# Patient Record
Sex: Male | Born: 1960 | Race: White | Hispanic: No
Health system: Southern US, Community
[De-identification: ages and names within clinical notes are randomized; demographics above are authoritative.]

---

## 2002-10-04 ENCOUNTER — Emergency Department (HOSPITAL_COMMUNITY): Admission: EM | Admit: 2002-10-04 | Discharge: 2002-10-04 | Payer: Self-pay | Admitting: Emergency Medicine

## 2002-10-04 ENCOUNTER — Encounter: Payer: Self-pay | Admitting: Emergency Medicine

## 2002-11-14 ENCOUNTER — Emergency Department (HOSPITAL_COMMUNITY): Admission: EM | Admit: 2002-11-14 | Discharge: 2002-11-14 | Payer: Self-pay | Admitting: Emergency Medicine

## 2002-11-14 ENCOUNTER — Encounter: Payer: Self-pay | Admitting: Emergency Medicine

## 2002-11-20 ENCOUNTER — Ambulatory Visit (HOSPITAL_COMMUNITY): Admission: RE | Admit: 2002-11-20 | Discharge: 2002-11-20 | Payer: Self-pay | Admitting: Infectious Diseases

## 2002-11-20 ENCOUNTER — Encounter: Admission: RE | Admit: 2002-11-20 | Discharge: 2002-11-20 | Payer: Self-pay | Admitting: Infectious Diseases

## 2002-11-26 ENCOUNTER — Emergency Department (HOSPITAL_COMMUNITY): Admission: EM | Admit: 2002-11-26 | Discharge: 2002-11-26 | Payer: Self-pay | Admitting: Emergency Medicine

## 2002-11-26 ENCOUNTER — Encounter: Payer: Self-pay | Admitting: Emergency Medicine

## 2002-12-04 ENCOUNTER — Encounter: Admission: RE | Admit: 2002-12-04 | Discharge: 2002-12-04 | Payer: Self-pay | Admitting: Infectious Diseases

## 2002-12-17 ENCOUNTER — Encounter: Payer: Self-pay | Admitting: Emergency Medicine

## 2002-12-17 ENCOUNTER — Emergency Department (HOSPITAL_COMMUNITY): Admission: EM | Admit: 2002-12-17 | Discharge: 2002-12-17 | Payer: Self-pay | Admitting: Emergency Medicine

## 2003-01-08 ENCOUNTER — Encounter: Admission: RE | Admit: 2003-01-08 | Discharge: 2003-01-08 | Payer: Self-pay | Admitting: Infectious Diseases

## 2003-03-12 ENCOUNTER — Encounter: Admission: RE | Admit: 2003-03-12 | Discharge: 2003-03-12 | Payer: Self-pay | Admitting: Infectious Diseases

## 2003-04-30 ENCOUNTER — Encounter: Admission: RE | Admit: 2003-04-30 | Discharge: 2003-04-30 | Payer: Self-pay | Admitting: Infectious Diseases

## 2003-07-09 ENCOUNTER — Encounter: Admission: RE | Admit: 2003-07-09 | Discharge: 2003-07-09 | Payer: Self-pay | Admitting: Infectious Diseases

## 2003-09-24 ENCOUNTER — Encounter: Admission: RE | Admit: 2003-09-24 | Discharge: 2003-09-24 | Payer: Self-pay | Admitting: Infectious Diseases

## 2003-09-24 ENCOUNTER — Encounter: Payer: Self-pay | Admitting: Infectious Diseases

## 2003-09-24 ENCOUNTER — Ambulatory Visit (HOSPITAL_COMMUNITY): Admission: RE | Admit: 2003-09-24 | Discharge: 2003-09-24 | Payer: Self-pay | Admitting: Infectious Diseases

## 2004-02-18 ENCOUNTER — Other Ambulatory Visit: Payer: Self-pay

## 2004-03-04 ENCOUNTER — Other Ambulatory Visit: Payer: Self-pay

## 2004-03-05 ENCOUNTER — Other Ambulatory Visit: Payer: Self-pay

## 2004-03-06 ENCOUNTER — Other Ambulatory Visit: Payer: Self-pay

## 2004-03-07 ENCOUNTER — Other Ambulatory Visit: Payer: Self-pay

## 2004-05-06 ENCOUNTER — Other Ambulatory Visit: Payer: Self-pay

## 2005-05-27 ENCOUNTER — Inpatient Hospital Stay: Payer: Self-pay | Admitting: Specialist

## 2005-05-27 ENCOUNTER — Other Ambulatory Visit: Payer: Self-pay

## 2005-06-24 ENCOUNTER — Emergency Department: Payer: Self-pay | Admitting: Emergency Medicine

## 2005-06-24 ENCOUNTER — Other Ambulatory Visit: Payer: Self-pay

## 2005-06-28 ENCOUNTER — Emergency Department: Payer: Self-pay | Admitting: Emergency Medicine

## 2005-07-06 ENCOUNTER — Inpatient Hospital Stay: Payer: Self-pay | Admitting: Specialist

## 2005-10-10 ENCOUNTER — Other Ambulatory Visit: Payer: Self-pay

## 2005-10-10 ENCOUNTER — Emergency Department: Payer: Self-pay | Admitting: Emergency Medicine

## 2006-02-16 ENCOUNTER — Inpatient Hospital Stay: Payer: Self-pay | Admitting: Psychiatry

## 2006-02-16 ENCOUNTER — Other Ambulatory Visit: Payer: Self-pay

## 2006-07-06 ENCOUNTER — Ambulatory Visit: Payer: Self-pay | Admitting: Family Medicine

## 2006-09-29 ENCOUNTER — Other Ambulatory Visit: Payer: Self-pay

## 2006-09-29 ENCOUNTER — Emergency Department: Payer: Self-pay | Admitting: Emergency Medicine

## 2006-10-26 ENCOUNTER — Other Ambulatory Visit: Payer: Self-pay

## 2006-10-26 ENCOUNTER — Inpatient Hospital Stay: Payer: Self-pay | Admitting: Internal Medicine

## 2007-02-03 ENCOUNTER — Emergency Department (HOSPITAL_COMMUNITY): Admission: EM | Admit: 2007-02-03 | Discharge: 2007-02-03 | Payer: Self-pay | Admitting: Emergency Medicine

## 2007-02-13 ENCOUNTER — Ambulatory Visit (HOSPITAL_COMMUNITY): Admission: RE | Admit: 2007-02-13 | Discharge: 2007-02-13 | Payer: Self-pay | Admitting: Family Medicine

## 2007-04-07 ENCOUNTER — Ambulatory Visit: Payer: Self-pay | Admitting: Specialist

## 2007-07-03 ENCOUNTER — Ambulatory Visit: Payer: Self-pay | Admitting: Specialist

## 2008-03-11 ENCOUNTER — Encounter: Payer: Self-pay | Admitting: Orthopedic Surgery

## 2008-03-26 ENCOUNTER — Encounter: Payer: Self-pay | Admitting: Orthopedic Surgery

## 2008-04-25 ENCOUNTER — Encounter: Payer: Self-pay | Admitting: Orthopedic Surgery

## 2008-05-02 ENCOUNTER — Ambulatory Visit (HOSPITAL_COMMUNITY): Admission: RE | Admit: 2008-05-02 | Discharge: 2008-05-02 | Payer: Self-pay | Admitting: *Deleted

## 2008-06-04 ENCOUNTER — Other Ambulatory Visit: Payer: Self-pay

## 2008-06-04 ENCOUNTER — Emergency Department: Payer: Self-pay | Admitting: Emergency Medicine

## 2008-08-03 ENCOUNTER — Emergency Department: Payer: Self-pay | Admitting: Emergency Medicine

## 2008-08-05 ENCOUNTER — Emergency Department: Payer: Self-pay | Admitting: Emergency Medicine

## 2008-08-05 ENCOUNTER — Other Ambulatory Visit: Payer: Self-pay

## 2008-08-09 ENCOUNTER — Other Ambulatory Visit: Payer: Self-pay

## 2008-08-09 ENCOUNTER — Emergency Department: Payer: Self-pay | Admitting: Emergency Medicine

## 2008-08-26 ENCOUNTER — Other Ambulatory Visit: Payer: Self-pay

## 2008-08-26 ENCOUNTER — Inpatient Hospital Stay: Payer: Self-pay | Admitting: Psychiatry

## 2008-08-31 ENCOUNTER — Other Ambulatory Visit: Payer: Self-pay

## 2008-09-12 ENCOUNTER — Emergency Department: Payer: Self-pay | Admitting: Emergency Medicine

## 2008-11-23 ENCOUNTER — Inpatient Hospital Stay: Payer: Self-pay | Admitting: Specialist

## 2009-05-28 ENCOUNTER — Inpatient Hospital Stay: Payer: Self-pay | Admitting: Specialist

## 2009-06-27 ENCOUNTER — Inpatient Hospital Stay: Payer: Self-pay | Admitting: Specialist

## 2010-04-16 ENCOUNTER — Emergency Department: Payer: Self-pay | Admitting: Unknown Physician Specialty

## 2010-05-29 ENCOUNTER — Inpatient Hospital Stay: Payer: Self-pay | Admitting: Psychiatry

## 2011-05-13 NOTE — Procedures (Signed)
   NAME:  [name] [name], [name] [name]                             ACCOUNT NO.:  [id number]   MEDICAL RECORD NO.:  [id number]                   PATIENT TYPE:  EMS   LOCATION:  ED                                   FACILITY:  [facility]   PHYSICIAN:  Edward L. Juanetta Gosling, M.D.             DATE OF BIRTH:  [birth year]   DATE OF PROCEDURE:  DATE OF DISCHARGE:                                EKG INTERPRETATION   The rhythm is a sinus rhythm with a rate in the 70s.  There are Q-waves  inferiorly which may indicate a previous inferior myocardial infarction and  clinical correlation is suggested.  The axis is leftward.  There are T-wave  abnormalities in some of the precordial leads which may indicate ischemia  and there are also Q-waves in the lateral chest leads.  Abnormal  electrocardiogram.                                               Oneal Deputy. Juanetta Gosling, M.D.    ELH/MEDQ  D:  11/26/2002  T:  11/26/2002  Job:  [id number]

## 2011-05-13 NOTE — Consult Note (Signed)
NAMESTARR, [name] [name]                             ACCOUNT NO.:  [id number]   MEDICAL RECORD NO.:  [id number]                   PATIENT TYPE:  EMS   LOCATION:  ED                                   FACILITY:  [facility]   PHYSICIAN:  Janetta Hora. Hulan Saas, M.D.             DATE OF BIRTH:  [birth year]   DATE OF CONSULTATION:  DATE OF DISCHARGE:  11/14/2002                                   CONSULTATION   HISTORY OF PRESENT ILLNESS:  The patient is a 50 year old man with a history  of HIV infection, history of questionable coronary artery disease.  Presented to the ER this afternoon with a complaint of chest pain.  He  described the chest pain as left-sided, sharp in character, nonradiating,  associated with palpitations.  No nausea.  No vomiting.  No shortness of  breath.  The pain occurred while he was walking to eat his breakfast in a  restaurant.   REVIEW OF SYSTEMS:  He denies any dizziness.  Denies any dysuria.  Denies  any weakness or fever.  Denies any cough.  Denies any abdominal pain,  diarrhea, vomiting.   PAST MEDICAL HISTORY:  He has had HIV since 17 years ago.  A CD-4 count, the  last __________  check was 38 but he does not know the viral load.  He is  not on any antiretroviral medication.  He since developed a sort of chest  pain.  He has had cardiac catheterization done one year ago which was  negative for any blockage of any of the vessels.  He also has a history of  depression.   MEDICATIONS:  He is currently not on any medications at the moment.   ALLERGIES:  He has no known drug allergies.   FAMILY HISTORY:  Noncontributory.   SOCIAL HISTORY:  He is divorced.  He does not have any children.  He lives  in a rest home.  He is an ex-IV drug user.  He smokes about six cigarettes  every day and drinks alcohol occasionally.   PHYSICAL EXAMINATION:  VITAL SIGNS:  BP is 105/64, heart rate 72.  GENERAL:  He is a middle aged man lying comfortably on a stretcher, not in  any  apparent distress.  HEENT:  He is not pale.  He is anicteric.  He has no oral thrush.  NECK:  Supple.  There is no jugular venous distention.  CHEST:  He has bilateral expiratory wheezes.  No crackles were heard.  Air  entry is good bilaterally.  CARDIAC:  PMI is located in the fifth left intercostal space anterior  axillary line.  Heart sounds 1 and 2 were regular.  No murmurs were  palpated.  ABDOMEN:  Soft, nontender.  No masses or organomegaly were felt.  Bowel  sounds are present.  CNS:  He is alert and oriented x3.  He has no focal or gross  neurologic  deficits.  EXTREMITIES:  He has no pedal edema.  SKIN:  No rashes or ulcerations were noted.   LABORATORIES:  EKG was normal sinus rhythm with left axis deviation.  Normal  intervals.  He had some Q-waves in lead II, III, and aVF, T-wave inversion  in lead V2.  His chest x-ray shows COPD, no acute infiltrate.  His cardiac  enzymes troponin 0.01, CK 72,  MB fraction is 1.6.  His sodium is 136,  potassium 4.1, chloride 106, CO2 30, BUN 6, creatinine 0.9, chloride 103,  calcium 9.5.  WBC 3.9, hemoglobin 15.8, hematocrit 46.7, MCV 88.8, platelet  count 108.  Neutrophil 54%, lymphocyte 28%, monocyte 9%.  The patient has  already been treated with Lovenox and aspirin in the emergency room.   ASSESSMENT:  1. Chest pain with abnormal EKG.  Admit patient to rule out acute myocardial     infarction.  The patient is to be admitted to rule out MI with serial     cardiac enzymes for telemetry monitoring.  Will also be given aspirin,     nitroglycerin, and beta blockers.  Cardiology consult to call for     evaluation.  2. For the patient's history of HIV infection and AIDS, his CD-4 count and     viral load were monitored.  The patient, however, refuses to be admitted     at this time.  The risks and benefits of admission __________  have been     explained to patient and patient has been given time to think about it     but he still signed  out against medical advice.  The patient will then be     discharged on aspirin, metoprolol, nitroglycerin p.r.n.  The patient has     been advised to see his primary M.D. within one to two days.  The     patient's discharge condition is stable.     [name] [name], M.D.                        Janetta Hora. Hulan Saas, M.D.    DW/MEDQ  D:  11/14/2002  T:  11/14/2002  Job:  [id number]

## 2011-05-13 NOTE — Procedures (Signed)
   NAME:  [name] [name], [name] [name]                             ACCOUNT NO.:  [id number]   MEDICAL RECORD NO.:  [id number]                   PATIENT TYPE:  EMS   LOCATION:  ED                                   FACILITY:  [facility]   PHYSICIAN:  Edward L. Juanetta Gosling, M.D.             DATE OF BIRTH:  09/03/1961   DATE OF PROCEDURE:  DATE OF DISCHARGE:  12/17/2002                                EKG INTERPRETATION   TIME AND DATE:  1819 on 12/17/02   INTERPRETATION:  The rhythm is sinus rhythm with a rate in the 90s.  There  are Q waves inferiorly which could indicate a previous inferior myocardial  infarction.  T wave abnormalities are seen anteriorly and laterally which  could be related to ischemia and clinical correlation is suggested.   IMPRESSION:  Abnormal electrocardiogram.                                               Edward L. Juanetta Gosling, M.D.    ELH/MEDQ  D:  12/20/2002  T:  12/20/2002  Job:  [id number]

## 2011-12-26 ENCOUNTER — Inpatient Hospital Stay: Payer: Self-pay | Admitting: *Deleted

## 2011-12-27 LAB — BASIC METABOLIC PANEL
Calcium, Total: 8.5 mg/dL (ref 8.5–10.1)
Chloride: 109 mmol/L — ABNORMAL HIGH (ref 98–107)
Co2: 23 mmol/L (ref 21–32)
EGFR (African American): >60
Osmolality: 284 (ref 275–301)
Potassium: 3.8 mmol/L (ref 3.5–5.1)
Sodium: 140 mmol/L (ref 136–145)

## 2011-12-27 LAB — CBC WITH DIFFERENTIAL/PLATELET
Basophil #: 0 10*3/uL (ref 0.0–0.1)
Eosinophil %: 0 %
HGB: 13.2 g/dL (ref 13.0–18.0)
Lymphocyte %: 18.3 %
MCH: 33.3 pg (ref 26.0–34.0)
MCV: 98 fL (ref 80–100)
Monocyte %: 3.7 %
Neutrophil #: 9.4 10*3/uL — ABNORMAL HIGH (ref 1.4–6.5)
Neutrophil %: 77.8 %
RBC: 3.98 10*6/uL — ABNORMAL LOW (ref 4.40–5.90)

## 2011-12-27 LAB — PROTIME-INR
INR: 2.5
Prothrombin Time: 26.3 secs — ABNORMAL HIGH (ref 11.5–14.7)

## 2011-12-27 LAB — TROPONIN I: Troponin-I: 0.03 ng/mL

## 2011-12-27 LAB — LIPID PANEL
Cholesterol: 147 mg/dL (ref 0–200)
HDL Cholesterol: 32 mg/dL — ABNORMAL LOW (ref 40–60)
Triglycerides: 70 mg/dL (ref 0–200)

## 2011-12-28 LAB — DRUG SCREEN, URINE
Amphetamines, Ur Screen: NEGATIVE (ref ?–1000)
Benzodiazepine, Ur Scrn: NEGATIVE (ref ?–200)
Cannabinoid 50 Ng, Ur ~~LOC~~: NEGATIVE (ref ?–50)
MDMA (Ecstasy)Ur Screen: NEGATIVE (ref ?–500)
Opiate, Ur Screen: POSITIVE (ref ?–300)
Tricyclic, Ur Screen: NEGATIVE (ref ?–1000)

## 2011-12-28 LAB — CBC WITH DIFFERENTIAL/PLATELET
Eosinophil #: 0 10*3/uL (ref 0.0–0.7)
Eosinophil %: 0 %
Lymphocyte #: 2.1 10*3/uL (ref 1.0–3.6)
Lymphocyte %: 16.2 %
MCH: 32.8 pg (ref 26.0–34.0)
MCHC: 33.4 g/dL (ref 32.0–36.0)
MCV: 98 fL (ref 80–100)
Monocyte #: 0.5 10*3/uL (ref 0.0–0.7)
Neutrophil %: 79.4 %
Platelet: 136 10*3/uL — ABNORMAL LOW (ref 150–440)
RBC: 3.83 10*6/uL — ABNORMAL LOW (ref 4.40–5.90)
WBC: 12.8 10*3/uL — ABNORMAL HIGH (ref 3.8–10.6)

## 2011-12-28 LAB — BASIC METABOLIC PANEL
BUN: 17 mg/dL (ref 7–18)
Chloride: 110 mmol/L — ABNORMAL HIGH (ref 98–107)
Creatinine: 0.65 mg/dL (ref 0.60–1.30)
EGFR (African American): >60
Glucose: 133 mg/dL — ABNORMAL HIGH (ref 65–99)
Potassium: 3.9 mmol/L (ref 3.5–5.1)

## 2011-12-28 LAB — PROTIME-INR
INR: 3.7
Prothrombin Time: 35.4 secs — ABNORMAL HIGH (ref 11.5–14.7)

## 2011-12-29 LAB — PROTIME-INR
INR: 2.2
Prothrombin Time: 24.2 secs — ABNORMAL HIGH (ref 11.5–14.7)

## 2011-12-30 LAB — CBC WITH DIFFERENTIAL/PLATELET
Basophil %: 0.1 %
Eosinophil %: 0 %
HCT: 38.9 % — ABNORMAL LOW (ref 40.0–52.0)
HGB: 12.9 g/dL — ABNORMAL LOW (ref 13.0–18.0)
Lymphocyte #: 3.3 10*3/uL (ref 1.0–3.6)
Lymphocyte %: 25.6 %
MCH: 32.9 pg (ref 26.0–34.0)
MCV: 99 fL (ref 80–100)
Monocyte #: 0.9 10*3/uL — ABNORMAL HIGH (ref 0.0–0.7)
Monocyte %: 7.2 %
Neutrophil #: 8.5 10*3/uL — ABNORMAL HIGH (ref 1.4–6.5)
RBC: 3.92 10*6/uL — ABNORMAL LOW (ref 4.40–5.90)
RDW: 13.5 % (ref 11.5–14.5)
WBC: 12.7 10*3/uL — ABNORMAL HIGH (ref 3.8–10.6)

## 2011-12-30 LAB — PROTIME-INR: INR: 1.5

## 2011-12-31 LAB — PROTIME-INR

## 2011-12-31 LAB — BASIC METABOLIC PANEL
Anion Gap: 7 (ref 7–16)
Calcium, Total: 8.5 mg/dL (ref 8.5–10.1)
Co2: 29 mmol/L (ref 21–32)
EGFR (African American): >60
Osmolality: 280 (ref 275–301)

## 2012-01-02 LAB — EXPECTORATED SPUTUM ASSESSMENT W GRAM STAIN, RFLX TO RESP C

## 2012-01-06 LAB — BRONCHIAL WASH CULTURE

## 2012-06-26 ENCOUNTER — Inpatient Hospital Stay: Payer: Self-pay | Admitting: Internal Medicine

## 2012-06-26 LAB — COMPREHENSIVE METABOLIC PANEL
Alkaline Phosphatase: 136 U/L (ref 50–136)
Anion Gap: 8 (ref 7–16)
Calcium, Total: 8.9 mg/dL (ref 8.5–10.1)
Co2: 34 mmol/L — ABNORMAL HIGH (ref 21–32)
EGFR (African American): >60
EGFR (Non-African Amer.): >60
Osmolality: 262 (ref 275–301)
Potassium: 2.8 mmol/L — ABNORMAL LOW (ref 3.5–5.1)
Sodium: 132 mmol/L — ABNORMAL LOW (ref 136–145)

## 2012-06-26 LAB — PRO B NATRIURETIC PEPTIDE: B-Type Natriuretic Peptide: 1473 pg/mL — ABNORMAL HIGH (ref 0–125)

## 2012-06-26 LAB — VALPROIC ACID LEVEL: Valproic Acid: 11 ug/mL — ABNORMAL LOW

## 2012-06-26 LAB — CBC
HCT: 37.2 % — ABNORMAL LOW (ref 40.0–52.0)
MCH: 27.9 pg (ref 26.0–34.0)
MCHC: 31.7 g/dL — ABNORMAL LOW (ref 32.0–36.0)
MCV: 88 fL (ref 80–100)
RDW: 14.7 % — ABNORMAL HIGH (ref 11.5–14.5)

## 2012-06-27 DIAGNOSIS — I2510 Atherosclerotic heart disease of native coronary artery without angina pectoris: Secondary | ICD-10-CM

## 2012-06-27 DIAGNOSIS — Z01810 Encounter for preprocedural cardiovascular examination: Secondary | ICD-10-CM

## 2012-06-27 LAB — CBC WITH DIFFERENTIAL/PLATELET
Basophil #: 0 10*3/uL (ref 0.0–0.1)
Basophil #: 0.1 10*3/uL (ref 0.0–0.1)
Eosinophil #: 0.1 10*3/uL (ref 0.0–0.7)
Eosinophil %: 0 %
HCT: 29.1 % — ABNORMAL LOW (ref 40.0–52.0)
Lymphocyte #: 2.2 10*3/uL (ref 1.0–3.6)
Lymphocyte #: 2.9 10*3/uL (ref 1.0–3.6)
Lymphocyte %: 20 %
Lymphocyte %: 2.7 %
MCHC: 32.8 g/dL (ref 32.0–36.0)
MCV: 87 fL (ref 80–100)
Monocyte #: 1.1 x10 3/mm — ABNORMAL HIGH (ref 0.2–1.0)
Monocyte %: 6.4 %
Monocyte %: 2.6 %
Neutrophil %: 80.7 %
Neutrophil %: 94 %
Platelet: 379 10*3/uL (ref 150–440)
Platelet: 366 10*3/uL (ref 150–440)
Platelet: 424 10*3/uL (ref 150–440)
RBC: 3.27 10*6/uL — ABNORMAL LOW (ref 4.40–5.90)
RDW: 15 % — ABNORMAL HIGH (ref 11.5–14.5)
RDW: 15.4 % — ABNORMAL HIGH (ref 11.5–14.5)
WBC: 17.2 10*3/uL — ABNORMAL HIGH (ref 3.8–10.6)
WBC: 35.3 10*3/uL — ABNORMAL HIGH (ref 3.8–10.6)

## 2012-06-27 LAB — BODY FLUID CELL COUNT WITH DIFFERENTIAL
Eosinophil: 0 %
Lymphocytes: 10 %
Neutrophils: 90 %
Nucleated Cell Count: 90974 /mm3
Other Mononuclear Cells: 0 %

## 2012-06-27 LAB — PROTIME-INR
INR: 1.7
INR: 1.4
Prothrombin Time: 20.3 secs — ABNORMAL HIGH (ref 11.5–14.7)
Prothrombin Time: 17.2 secs — ABNORMAL HIGH (ref 11.5–14.7)

## 2012-06-27 LAB — PROTEIN, BODY FLUID

## 2012-06-27 LAB — BASIC METABOLIC PANEL
Anion Gap: 5 — ABNORMAL LOW (ref 7–16)
Calcium, Total: 8.5 mg/dL (ref 8.5–10.1)
Chloride: 94 mmol/L — ABNORMAL LOW (ref 98–107)
Co2: 34 mmol/L — ABNORMAL HIGH (ref 21–32)
Creatinine: 0.6 mg/dL (ref 0.60–1.30)
EGFR (African American): >60
EGFR (African American): >60
EGFR (Non-African Amer.): >60
Osmolality: 273 (ref 275–301)
Potassium: 3.4 mmol/L — ABNORMAL LOW (ref 3.5–5.1)
Sodium: 133 mmol/L — ABNORMAL LOW (ref 136–145)

## 2012-06-27 LAB — APTT: Activated PTT: 44.8 secs — ABNORMAL HIGH (ref 23.6–35.9)

## 2012-06-28 LAB — VANCOMYCIN, TROUGH: Vancomycin, Trough: 24 ug/mL (ref 10–20)

## 2012-06-28 LAB — EXPECTORATED SPUTUM ASSESSMENT W GRAM STAIN, RFLX TO RESP C

## 2012-06-29 LAB — TSH: Thyroid Stimulating Horm: 2.39 u[IU]/mL

## 2012-06-29 LAB — CBC WITH DIFFERENTIAL/PLATELET
Basophil %: 0.4 %
Eosinophil #: 0.1 10*3/uL (ref 0.0–0.7)
HCT: 24.2 % — ABNORMAL LOW (ref 40.0–52.0)
Lymphocyte #: 2.7 10*3/uL (ref 1.0–3.6)
MCH: 28.3 pg (ref 26.0–34.0)
MCHC: 32.4 g/dL (ref 32.0–36.0)
MCV: 87 fL (ref 80–100)
Neutrophil #: 17.1 10*3/uL — ABNORMAL HIGH (ref 1.4–6.5)
RDW: 15.4 % — ABNORMAL HIGH (ref 11.5–14.5)

## 2012-06-29 LAB — BASIC METABOLIC PANEL
Anion Gap: 3 — ABNORMAL LOW (ref 7–16)
BUN: 8 mg/dL (ref 7–18)
Chloride: 104 mmol/L (ref 98–107)
Creatinine: 0.82 mg/dL (ref 0.60–1.30)
EGFR (African American): >60
Sodium: 139 mmol/L (ref 136–145)

## 2012-06-29 LAB — HEMOGLOBIN: HGB: 7.5 g/dL — ABNORMAL LOW (ref 13.0–18.0)

## 2012-06-29 LAB — MAGNESIUM: Magnesium: 1.8 mg/dL

## 2012-06-29 LAB — VANCOMYCIN, TROUGH: Vancomycin, Trough: 35 ug/mL (ref 10–20)

## 2012-06-29 LAB — PHOSPHORUS: Phosphorus: 2.8 mg/dL (ref 2.5–4.9)

## 2012-06-30 LAB — CBC WITH DIFFERENTIAL/PLATELET
Basophil #: 0.1 10*3/uL (ref 0.0–0.1)
Basophil %: 0.6 %
HCT: 28.8 % — ABNORMAL LOW (ref 40.0–52.0)
Lymphocyte #: 2.3 10*3/uL (ref 1.0–3.6)
MCH: 28.4 pg (ref 26.0–34.0)
MCHC: 32.5 g/dL (ref 32.0–36.0)
MCV: 87 fL (ref 80–100)
Monocyte %: 6.3 %
Neutrophil #: 11.7 10*3/uL — ABNORMAL HIGH (ref 1.4–6.5)
RDW: 15.7 % — ABNORMAL HIGH (ref 11.5–14.5)

## 2012-06-30 LAB — CREATININE, SERUM
EGFR (African American): >60
EGFR (Non-African Amer.): >60

## 2012-06-30 LAB — VANCOMYCIN, RANDOM: Vancomycin, Random: 15 ug/mL

## 2012-07-01 LAB — CBC WITH DIFFERENTIAL/PLATELET
Basophil #: 0.1 10*3/uL (ref 0.0–0.1)
Basophil %: 0.3 %
Eosinophil #: 0.3 10*3/uL (ref 0.0–0.7)
Lymphocyte %: 9.4 %
MCH: 28.4 pg (ref 26.0–34.0)
MCHC: 32.1 g/dL (ref 32.0–36.0)
Monocyte #: 1 x10 3/mm (ref 0.2–1.0)
Neutrophil %: 83.6 %
Platelet: 301 10*3/uL (ref 150–440)
RDW: 15.4 % — ABNORMAL HIGH (ref 11.5–14.5)
WBC: 18.5 10*3/uL — ABNORMAL HIGH (ref 3.8–10.6)

## 2012-07-01 LAB — BASIC METABOLIC PANEL
Anion Gap: 4 — ABNORMAL LOW (ref 7–16)
BUN: 9 mg/dL (ref 7–18)
Calcium, Total: 8.3 mg/dL — ABNORMAL LOW (ref 8.5–10.1)
Glucose: 108 mg/dL — ABNORMAL HIGH (ref 65–99)
Osmolality: 277 (ref 275–301)

## 2012-07-01 LAB — CULTURE, BLOOD (SINGLE)

## 2012-07-02 LAB — CBC WITH DIFFERENTIAL/PLATELET
Basophil %: 0.2 %
Eosinophil %: 0.9 %
Lymphocyte #: 1.7 10*3/uL (ref 1.0–3.6)
MCH: 28.2 pg (ref 26.0–34.0)
MCHC: 31.9 g/dL — ABNORMAL LOW (ref 32.0–36.0)
MCV: 89 fL (ref 80–100)
Monocyte #: 1 x10 3/mm (ref 0.2–1.0)
Platelet: 309 10*3/uL (ref 150–440)
RBC: 3.21 10*6/uL — ABNORMAL LOW (ref 4.40–5.90)

## 2012-07-02 LAB — BASIC METABOLIC PANEL
Calcium, Total: 8.4 mg/dL — ABNORMAL LOW (ref 8.5–10.1)
Chloride: 105 mmol/L (ref 98–107)
Co2: 31 mmol/L (ref 21–32)
EGFR (African American): >60
Glucose: 104 mg/dL — ABNORMAL HIGH (ref 65–99)
Osmolality: 279 (ref 275–301)
Potassium: 4.2 mmol/L (ref 3.5–5.1)

## 2012-07-03 LAB — BASIC METABOLIC PANEL
Calcium, Total: 9 mg/dL (ref 8.5–10.1)
Creatinine: 0.62 mg/dL (ref 0.60–1.30)
EGFR (African American): >60
EGFR (Non-African Amer.): >60
Glucose: 98 mg/dL (ref 65–99)
Osmolality: 277 (ref 275–301)
Potassium: 3.9 mmol/L (ref 3.5–5.1)
Sodium: 139 mmol/L (ref 136–145)

## 2012-07-03 LAB — CBC WITH DIFFERENTIAL/PLATELET
Basophil %: 0.3 %
Eosinophil %: 0.9 %
HGB: 7.9 g/dL — ABNORMAL LOW (ref 13.0–18.0)
Lymphocyte #: 1.7 10*3/uL (ref 1.0–3.6)
MCH: 27.6 pg (ref 26.0–34.0)
MCV: 88 fL (ref 80–100)
Monocyte %: 5.7 %
Neutrophil #: 15.2 10*3/uL — ABNORMAL HIGH (ref 1.4–6.5)
Neutrophil %: 83.6 %
Platelet: 379 10*3/uL (ref 150–440)
RBC: 2.87 10*6/uL — ABNORMAL LOW (ref 4.40–5.90)
WBC: 18.2 10*3/uL — ABNORMAL HIGH (ref 3.8–10.6)

## 2012-07-03 LAB — HEMOGLOBIN: HGB: 10.4 g/dL — ABNORMAL LOW (ref 13.0–18.0)

## 2012-07-04 LAB — CBC WITH DIFFERENTIAL/PLATELET
Basophil %: 0.4 %
Eosinophil #: 0.1 10*3/uL (ref 0.0–0.7)
Eosinophil %: 0.7 %
HCT: 32.1 % — ABNORMAL LOW (ref 40.0–52.0)
HGB: 10.4 g/dL — ABNORMAL LOW (ref 13.0–18.0)
MCH: 28.3 pg (ref 26.0–34.0)
MCHC: 32.3 g/dL (ref 32.0–36.0)
Monocyte #: 1.1 x10 3/mm — ABNORMAL HIGH (ref 0.2–1.0)
Monocyte %: 6.6 %
Neutrophil #: 13.1 10*3/uL — ABNORMAL HIGH (ref 1.4–6.5)
Neutrophil %: 80.7 %
Platelet: 389 10*3/uL (ref 150–440)
RBC: 3.67 10*6/uL — ABNORMAL LOW (ref 4.40–5.90)
WBC: 16.3 10*3/uL — ABNORMAL HIGH (ref 3.8–10.6)

## 2012-07-04 LAB — BASIC METABOLIC PANEL
Anion Gap: 8 (ref 7–16)
Calcium, Total: 8.8 mg/dL (ref 8.5–10.1)
Chloride: 101 mmol/L (ref 98–107)
Co2: 28 mmol/L (ref 21–32)
Creatinine: 0.65 mg/dL (ref 0.60–1.30)
Potassium: 3.5 mmol/L (ref 3.5–5.1)
Sodium: 137 mmol/L (ref 136–145)

## 2012-07-05 LAB — CBC WITH DIFFERENTIAL/PLATELET
Basophil %: 0.9 %
Eosinophil #: 0.2 10*3/uL (ref 0.0–0.7)
Eosinophil %: 1.2 %
HGB: 10 g/dL — ABNORMAL LOW (ref 13.0–18.0)
Lymphocyte #: 1.9 10*3/uL (ref 1.0–3.6)
MCH: 28 pg (ref 26.0–34.0)
MCHC: 31.8 g/dL — ABNORMAL LOW (ref 32.0–36.0)
MCV: 88 fL (ref 80–100)
Monocyte #: 0.9 x10 3/mm (ref 0.2–1.0)
Neutrophil %: 76.4 %
Platelet: 361 10*3/uL (ref 150–440)

## 2012-07-06 LAB — CBC WITH DIFFERENTIAL/PLATELET
Basophil #: 0.1 10*3/uL (ref 0.0–0.1)
Basophil %: 0.4 %
Eosinophil #: 0.2 10*3/uL (ref 0.0–0.7)
HCT: 31.4 % — ABNORMAL LOW (ref 40.0–52.0)
HGB: 9.6 g/dL — ABNORMAL LOW (ref 13.0–18.0)
Lymphocyte #: 2 10*3/uL (ref 1.0–3.6)
Lymphocyte %: 15.3 %
MCH: 27.2 pg (ref 26.0–34.0)
MCHC: 30.7 g/dL — ABNORMAL LOW (ref 32.0–36.0)
Monocyte #: 1.1 x10 3/mm — ABNORMAL HIGH (ref 0.2–1.0)
Monocyte %: 8.2 %
Neutrophil #: 9.9 10*3/uL — ABNORMAL HIGH (ref 1.4–6.5)
RDW: 15.4 % — ABNORMAL HIGH (ref 11.5–14.5)
WBC: 13.3 10*3/uL — ABNORMAL HIGH (ref 3.8–10.6)

## 2012-07-07 LAB — CBC WITH DIFFERENTIAL/PLATELET
Basophil #: 0.1 10*3/uL (ref 0.0–0.1)
Eosinophil #: 0.1 10*3/uL (ref 0.0–0.7)
HCT: 31.9 % — ABNORMAL LOW (ref 40.0–52.0)
Lymphocyte #: 1.5 10*3/uL (ref 1.0–3.6)
Lymphocyte %: 8.5 %
MCH: 28.9 pg (ref 26.0–34.0)
MCHC: 32.7 g/dL (ref 32.0–36.0)
MCV: 88 fL (ref 80–100)
Monocyte #: 1.1 x10 3/mm — ABNORMAL HIGH (ref 0.2–1.0)
Monocyte %: 6.1 %
Neutrophil %: 84.2 %
Platelet: 377 10*3/uL (ref 150–440)
RDW: 15.7 % — ABNORMAL HIGH (ref 11.5–14.5)
WBC: 17.8 10*3/uL — ABNORMAL HIGH (ref 3.8–10.6)

## 2012-07-07 LAB — BASIC METABOLIC PANEL
Calcium, Total: 8.8 mg/dL (ref 8.5–10.1)
Chloride: 99 mmol/L (ref 98–107)
Co2: 30 mmol/L (ref 21–32)
Creatinine: 0.73 mg/dL (ref 0.60–1.30)
EGFR (African American): >60
Osmolality: 271 (ref 275–301)
Sodium: 135 mmol/L — ABNORMAL LOW (ref 136–145)

## 2012-07-08 LAB — CBC WITH DIFFERENTIAL/PLATELET
Basophil #: 0.1 x10 3/mm 3
Basophil %: 0.3 %
Eosinophil #: 0.1 x10 3/mm 3
Eosinophil %: 0.3 %
HCT: 32.8 % — ABNORMAL LOW
HGB: 10.6 g/dL — ABNORMAL LOW
Lymphocyte %: 4.3 %
Lymphs Abs: 1.4 x10 3/mm 3
MCH: 28.7 pg
MCHC: 32.4 g/dL
MCV: 89 fL
Monocyte #: 0.7 "x10 3/mm "
Monocyte %: 2.2 %
Neutrophil #: 30.9 x10 3/mm 3 — ABNORMAL HIGH
Neutrophil %: 92.9 %
Platelet: 458 x10 3/mm 3 — ABNORMAL HIGH
RBC: 3.7 x10 6/mm 3 — ABNORMAL LOW
RDW: 15.5 % — ABNORMAL HIGH
WBC: 33.2 x10 3/mm 3 — ABNORMAL HIGH

## 2012-07-09 LAB — CBC WITH DIFFERENTIAL/PLATELET
Basophil #: 0 10*3/uL (ref 0.0–0.1)
Eosinophil #: 0 10*3/uL (ref 0.0–0.7)
HCT: 29.1 % — ABNORMAL LOW (ref 40.0–52.0)
Lymphocyte %: 3.4 %
MCH: 28 pg (ref 26.0–34.0)
MCHC: 31.8 g/dL — ABNORMAL LOW (ref 32.0–36.0)
Neutrophil #: 36.4 10*3/uL — ABNORMAL HIGH (ref 1.4–6.5)
Neutrophil %: 95.5 %
Platelet: 395 10*3/uL (ref 150–440)
RDW: 15.7 % — ABNORMAL HIGH (ref 11.5–14.5)
WBC: 38.1 10*3/uL — ABNORMAL HIGH (ref 3.8–10.6)

## 2012-07-10 LAB — CBC WITH DIFFERENTIAL/PLATELET
Eosinophil %: 0 %
HGB: 9.3 g/dL — ABNORMAL LOW (ref 13.0–18.0)
Lymphocyte %: 3.7 %
MCH: 27.9 pg (ref 26.0–34.0)
Monocyte #: 0.2 x10 3/mm (ref 0.2–1.0)
Monocyte %: 1.4 %
Neutrophil %: 94.9 %
Platelet: 393 10*3/uL (ref 150–440)
RBC: 3.33 10*6/uL — ABNORMAL LOW (ref 4.40–5.90)
WBC: 17.2 10*3/uL — ABNORMAL HIGH (ref 3.8–10.6)

## 2012-07-10 LAB — COMPREHENSIVE METABOLIC PANEL
Albumin: 1.9 g/dL — ABNORMAL LOW (ref 3.4–5.0)
BUN: 17 mg/dL (ref 7–18)
Bilirubin,Total: 0.2 mg/dL (ref 0.2–1.0)
Calcium, Total: 9.5 mg/dL (ref 8.5–10.1)
Chloride: 101 mmol/L (ref 98–107)
Co2: 32 mmol/L (ref 21–32)
Creatinine: 0.8 mg/dL (ref 0.60–1.30)
EGFR (African American): >60
EGFR (Non-African Amer.): >60
Osmolality: 285 (ref 275–301)
SGOT(AST): 14 U/L — ABNORMAL LOW (ref 15–37)
SGPT (ALT): 12 U/L

## 2012-07-11 LAB — CBC WITH DIFFERENTIAL/PLATELET
Basophil #: 0 10*3/uL (ref 0.0–0.1)
Basophil %: 0.2 %
Eosinophil %: 0 %
Lymphocyte #: 1.6 10*3/uL (ref 1.0–3.6)
Lymphocyte %: 9.6 %
Monocyte %: 5.3 %
Neutrophil #: 14.1 10*3/uL — ABNORMAL HIGH (ref 1.4–6.5)
Neutrophil %: 84.9 %
Platelet: 418 10*3/uL (ref 150–440)
RBC: 3.47 10*6/uL — ABNORMAL LOW (ref 4.40–5.90)
WBC: 16.6 10*3/uL — ABNORMAL HIGH (ref 3.8–10.6)

## 2012-07-12 LAB — CBC WITH DIFFERENTIAL/PLATELET
Basophil #: 0.1 10*3/uL (ref 0.0–0.1)
Basophil %: 1.2 %
Eosinophil #: 0 10*3/uL (ref 0.0–0.7)
Eosinophil %: 0 %
HGB: 10.3 g/dL — ABNORMAL LOW (ref 13.0–18.0)
MCH: 28.6 pg (ref 26.0–34.0)
MCHC: 32.2 g/dL (ref 32.0–36.0)
MCV: 89 fL (ref 80–100)
Monocyte #: 0.7 x10 3/mm (ref 0.2–1.0)
Neutrophil #: 9.7 10*3/uL — ABNORMAL HIGH (ref 1.4–6.5)
Neutrophil %: 82.5 %
Platelet: 427 10*3/uL (ref 150–440)
RDW: 16.3 % — ABNORMAL HIGH (ref 11.5–14.5)

## 2012-07-12 LAB — BASIC METABOLIC PANEL
Anion Gap: 5 — ABNORMAL LOW (ref 7–16)
BUN: 16 mg/dL (ref 7–18)
Calcium, Total: 9.5 mg/dL (ref 8.5–10.1)
Chloride: 99 mmol/L (ref 98–107)
Co2: 33 mmol/L — ABNORMAL HIGH (ref 21–32)
Glucose: 105 mg/dL — ABNORMAL HIGH (ref 65–99)
Osmolality: 275 (ref 275–301)
Potassium: 4.1 mmol/L (ref 3.5–5.1)

## 2012-07-12 LAB — BRONCHIAL WASH CULTURE

## 2012-07-12 LAB — CLOSTRIDIUM DIFFICILE BY PCR

## 2012-07-14 LAB — BASIC METABOLIC PANEL
BUN: 14 mg/dL (ref 7–18)
Calcium, Total: 9.3 mg/dL (ref 8.5–10.1)
Creatinine: 0.51 mg/dL — ABNORMAL LOW (ref 0.60–1.30)
EGFR (African American): >60
EGFR (Non-African Amer.): >60
Glucose: 103 mg/dL — ABNORMAL HIGH (ref 65–99)
Osmolality: 273 (ref 275–301)
Potassium: 4.2 mmol/L (ref 3.5–5.1)

## 2012-07-14 LAB — CBC WITH DIFFERENTIAL/PLATELET
Basophil #: 0.1 10*3/uL (ref 0.0–0.1)
Eosinophil %: 0.7 %
HGB: 10.6 g/dL — ABNORMAL LOW (ref 13.0–18.0)
Lymphocyte #: 0.4 10*3/uL — ABNORMAL LOW (ref 1.0–3.6)
MCH: 27.4 pg (ref 26.0–34.0)
MCV: 90 fL (ref 80–100)
Monocyte #: 1.2 x10 3/mm — ABNORMAL HIGH (ref 0.2–1.0)
Monocyte %: 6 %
Neutrophil #: 18.4 10*3/uL — ABNORMAL HIGH (ref 1.4–6.5)
RBC: 3.85 10*6/uL — ABNORMAL LOW (ref 4.40–5.90)
RDW: 16.1 % — ABNORMAL HIGH (ref 11.5–14.5)
WBC: 20.2 10*3/uL — ABNORMAL HIGH (ref 3.8–10.6)

## 2012-07-15 LAB — CBC WITH DIFFERENTIAL/PLATELET
Eosinophil #: 0 10*3/uL (ref 0.0–0.7)
Eosinophil %: 0.1 %
HCT: 36.1 % — ABNORMAL LOW (ref 40.0–52.0)
HGB: 11.5 g/dL — ABNORMAL LOW (ref 13.0–18.0)
Lymphocyte #: 2.8 10*3/uL (ref 1.0–3.6)
MCH: 28.3 pg (ref 26.0–34.0)
MCV: 89 fL (ref 80–100)
Monocyte #: 1.1 x10 3/mm — ABNORMAL HIGH (ref 0.2–1.0)
Neutrophil #: 16.4 10*3/uL — ABNORMAL HIGH (ref 1.4–6.5)
Platelet: 369 10*3/uL (ref 150–440)
RBC: 4.04 10*6/uL — ABNORMAL LOW (ref 4.40–5.90)
RDW: 16.3 % — ABNORMAL HIGH (ref 11.5–14.5)

## 2012-07-16 LAB — CBC WITH DIFFERENTIAL/PLATELET
Basophil #: 0.1 10*3/uL (ref 0.0–0.1)
Eosinophil #: 0.1 10*3/uL (ref 0.0–0.7)
HGB: 11.1 g/dL — ABNORMAL LOW (ref 13.0–18.0)
Lymphocyte #: 2.3 10*3/uL (ref 1.0–3.6)
MCH: 28.7 pg (ref 26.0–34.0)
MCHC: 32.1 g/dL (ref 32.0–36.0)
MCV: 89 fL (ref 80–100)
Monocyte %: 5.5 %
Neutrophil #: 14.4 10*3/uL — ABNORMAL HIGH (ref 1.4–6.5)
RBC: 3.88 10*6/uL — ABNORMAL LOW (ref 4.40–5.90)
RDW: 17.1 % — ABNORMAL HIGH (ref 11.5–14.5)

## 2012-07-17 LAB — CBC WITH DIFFERENTIAL/PLATELET
Basophil #: 0.1 10*3/uL (ref 0.0–0.1)
Basophil %: 0.6 %
Eosinophil %: 1.1 %
HCT: 33.6 % — ABNORMAL LOW (ref 40.0–52.0)
HGB: 10.5 g/dL — ABNORMAL LOW (ref 13.0–18.0)
Lymphocyte %: 8.9 %
Monocyte %: 3.4 %
Neutrophil #: 18.6 10*3/uL — ABNORMAL HIGH (ref 1.4–6.5)
RBC: 3.73 10*6/uL — ABNORMAL LOW (ref 4.40–5.90)
WBC: 21.6 10*3/uL — ABNORMAL HIGH (ref 3.8–10.6)

## 2012-07-18 LAB — CBC WITH DIFFERENTIAL/PLATELET
Basophil #: 0 10*3/uL (ref 0.0–0.1)
Eosinophil #: 0.2 10*3/uL (ref 0.0–0.7)
Eosinophil %: 0.9 %
HGB: 9.3 g/dL — ABNORMAL LOW (ref 13.0–18.0)
Lymphocyte #: 2.2 10*3/uL (ref 1.0–3.6)
MCH: 27.4 pg (ref 26.0–34.0)
MCV: 90 fL (ref 80–100)
Monocyte #: 0.8 x10 3/mm (ref 0.2–1.0)
Neutrophil %: 84.5 %
Platelet: 235 10*3/uL (ref 150–440)
RBC: 3.39 10*6/uL — ABNORMAL LOW (ref 4.40–5.90)
RDW: 17.7 % — ABNORMAL HIGH (ref 11.5–14.5)
WBC: 20.7 10*3/uL — ABNORMAL HIGH (ref 3.8–10.6)

## 2012-07-19 LAB — CULTURE, FUNGUS WITHOUT SMEAR

## 2012-07-20 LAB — CBC WITH DIFFERENTIAL/PLATELET
Eosinophil #: 0.1 10*3/uL (ref 0.0–0.7)
Eosinophil %: 0.7 %
Lymphocyte #: 1.4 10*3/uL (ref 1.0–3.6)
MCH: 29.4 pg (ref 26.0–34.0)
MCHC: 33.1 g/dL (ref 32.0–36.0)
MCV: 89 fL (ref 80–100)
Monocyte #: 0.7 x10 3/mm (ref 0.2–1.0)
Platelet: 249 10*3/uL (ref 150–440)
RBC: 3.15 10*6/uL — ABNORMAL LOW (ref 4.40–5.90)
RDW: 18 % — ABNORMAL HIGH (ref 11.5–14.5)

## 2012-07-21 LAB — CBC WITH DIFFERENTIAL/PLATELET
Basophil #: 0 10*3/uL (ref 0.0–0.1)
Basophil %: 0.1 %
HCT: 28.2 % — ABNORMAL LOW (ref 40.0–52.0)
HGB: 9.3 g/dL — ABNORMAL LOW (ref 13.0–18.0)
Lymphocyte #: 2 10*3/uL (ref 1.0–3.6)
Lymphocyte %: 18.1 %
MCHC: 33 g/dL (ref 32.0–36.0)
Monocyte %: 8.8 %
Neutrophil #: 8.1 10*3/uL — ABNORMAL HIGH (ref 1.4–6.5)
RBC: 3.21 10*6/uL — ABNORMAL LOW (ref 4.40–5.90)
RDW: 17.8 % — ABNORMAL HIGH (ref 11.5–14.5)
WBC: 11.2 10*3/uL — ABNORMAL HIGH (ref 3.8–10.6)

## 2012-07-23 LAB — BODY FLUID CULTURE

## 2012-07-24 LAB — CBC WITH DIFFERENTIAL/PLATELET
Basophil #: 0.1 10*3/uL (ref 0.0–0.1)
Basophil %: 0.6 %
Eosinophil #: 0.1 10*3/uL (ref 0.0–0.7)
Eosinophil %: 0.7 %
HGB: 9.7 g/dL — ABNORMAL LOW (ref 13.0–18.0)
Lymphocyte #: 2.2 10*3/uL (ref 1.0–3.6)
Lymphocyte %: 17.7 %
MCH: 29.4 pg (ref 26.0–34.0)
MCHC: 33.2 g/dL (ref 32.0–36.0)
MCV: 88 fL (ref 80–100)
Monocyte #: 1.1 x10 3/mm — ABNORMAL HIGH (ref 0.2–1.0)
Neutrophil #: 8.8 10*3/uL — ABNORMAL HIGH (ref 1.4–6.5)
RBC: 3.3 10*6/uL — ABNORMAL LOW (ref 4.40–5.90)
WBC: 12.3 10*3/uL — ABNORMAL HIGH (ref 3.8–10.6)

## 2012-07-26 ENCOUNTER — Ambulatory Visit: Payer: Self-pay | Admitting: Surgery

## 2012-07-31 LAB — CULTURE, FUNGUS WITHOUT SMEAR

## 2012-08-02 ENCOUNTER — Ambulatory Visit: Payer: Self-pay | Admitting: Cardiothoracic Surgery

## 2012-08-13 ENCOUNTER — Ambulatory Visit: Payer: Self-pay | Admitting: Cardiothoracic Surgery

## 2012-08-16 ENCOUNTER — Ambulatory Visit: Payer: Self-pay | Admitting: Cardiothoracic Surgery

## 2012-08-23 ENCOUNTER — Ambulatory Visit: Payer: Self-pay | Admitting: Cardiothoracic Surgery

## 2012-08-29 ENCOUNTER — Ambulatory Visit: Payer: Self-pay | Admitting: Cardiothoracic Surgery

## 2012-09-05 ENCOUNTER — Ambulatory Visit: Payer: Self-pay | Admitting: Cardiothoracic Surgery

## 2012-09-20 ENCOUNTER — Ambulatory Visit: Payer: Self-pay | Admitting: Cardiothoracic Surgery

## 2012-10-26 IMAGING — CR DG CHEST 2V
1 series · 2 of 2 positions shown · non-contrast
Comparison: none

REASON FOR EXAM: Check Chest Tubes
COMMENTS:

[Series 1: ap · 0.17mm/px · 2 of 2 slices shown]
[im 1/2]
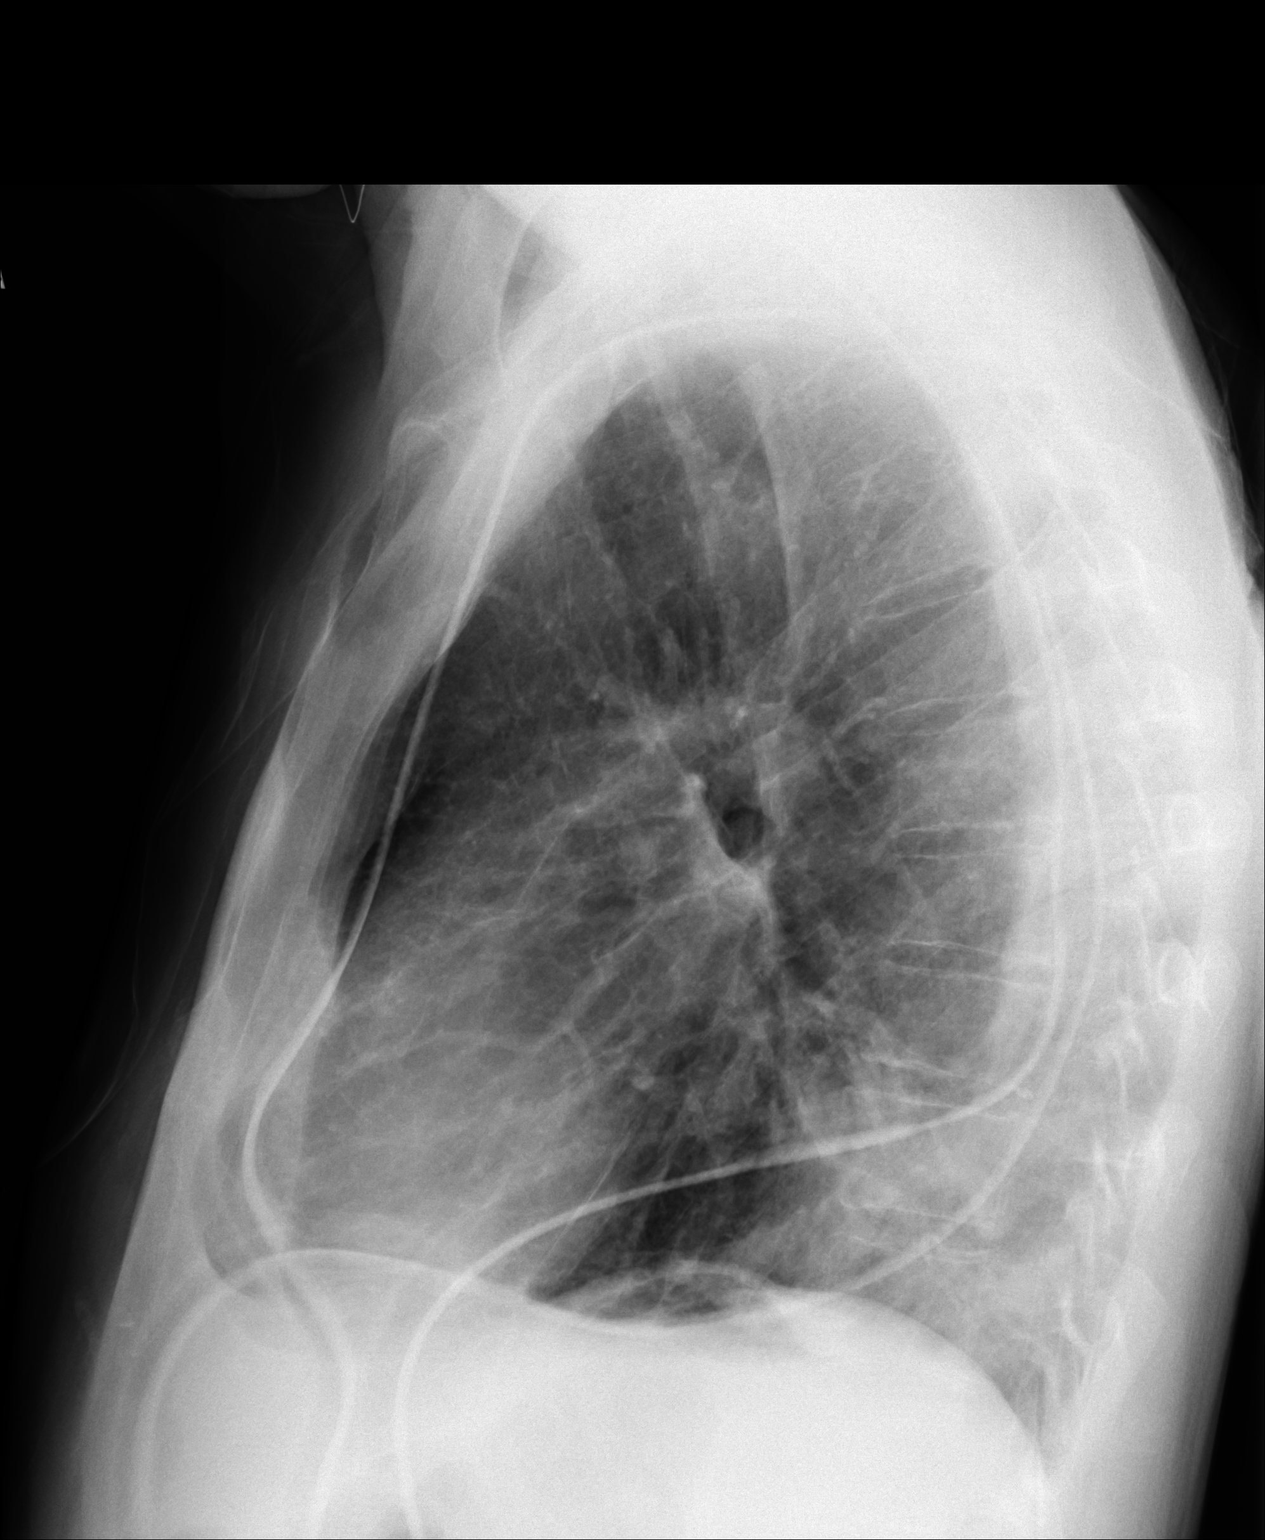
[im 2/2]
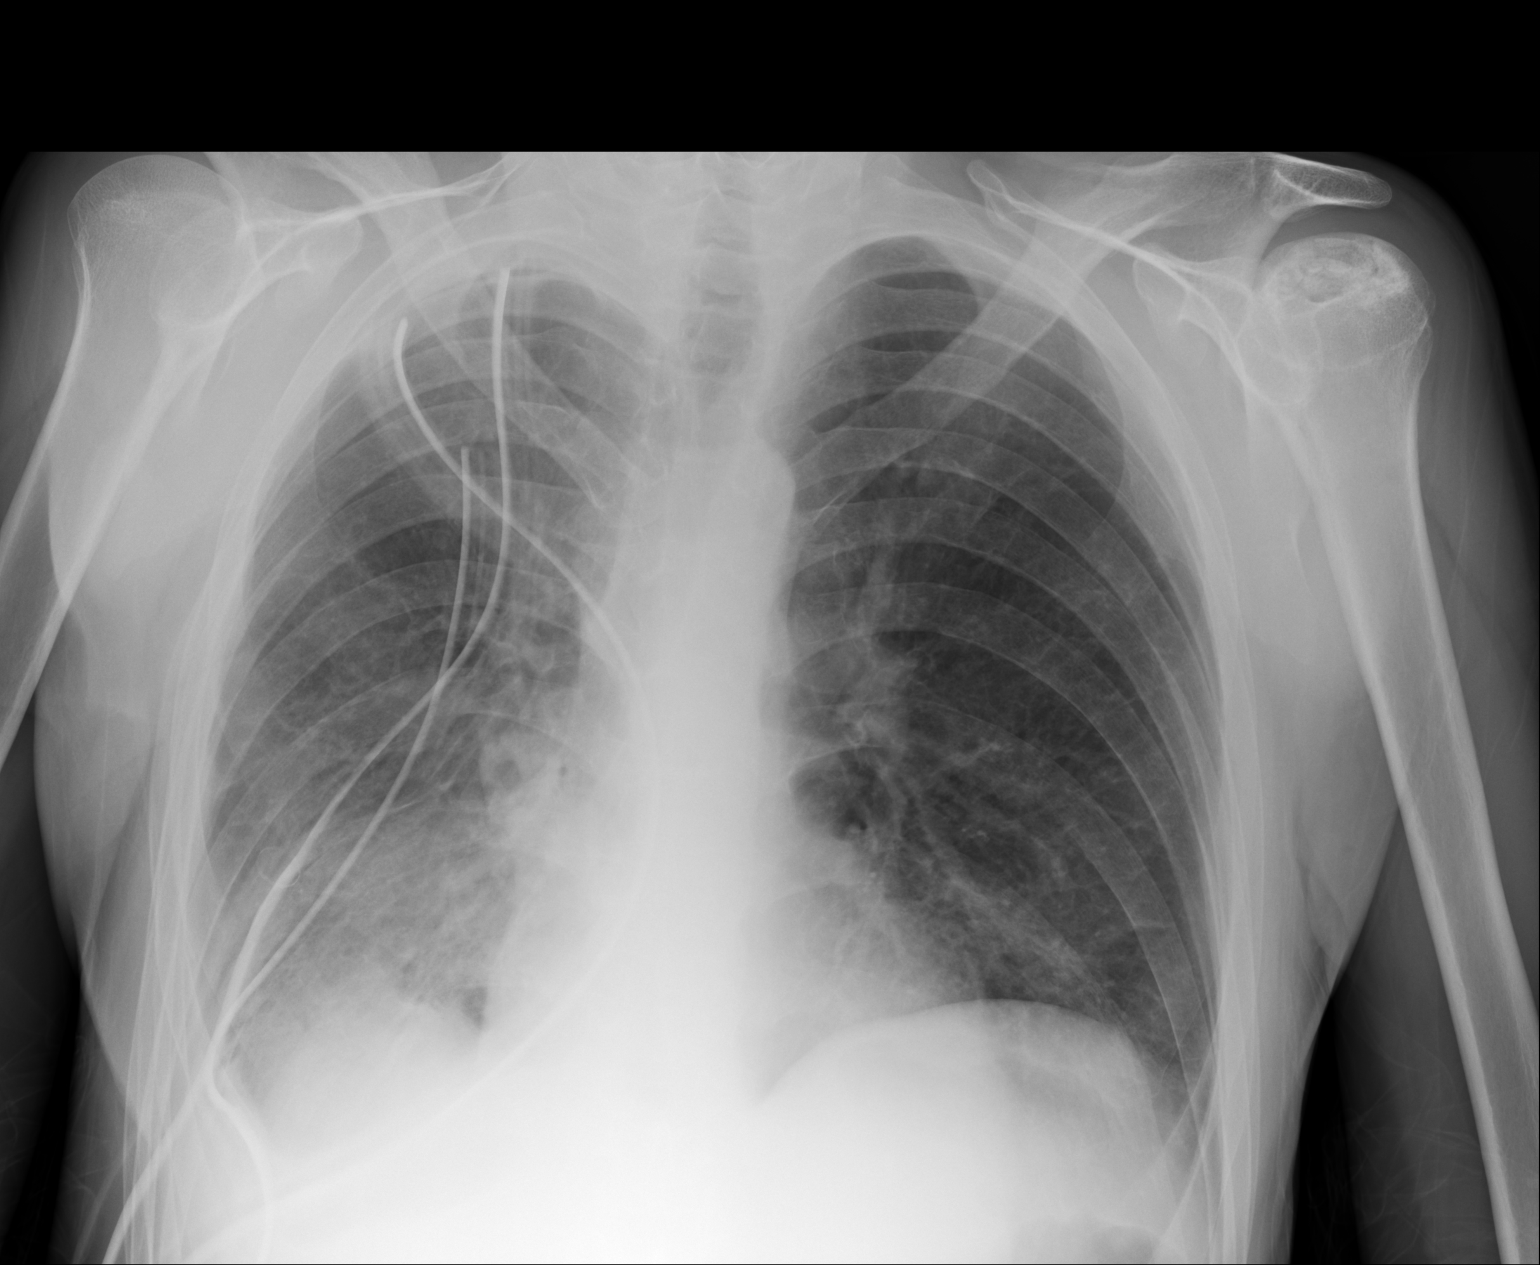

[2 of 2 positions shown; findings below may reference images not displayed]

PROCEDURE:     DXR - DXR CHEST PA (OR AP) AND LATERAL  - July 26, 2012 [DATE]

RESULT:     Chest tubes are no the right. Atelectasis versus pneumonia right
lung base. No significant pneumothorax. Mild atelectasis versus infiltrate
left lung base. Similar finding noted on prior exam of 07/23/2012. Heart size
normal. Central venous line has been removed.
IMPRESSION: Interim removal central venous line. Chest tubes in stable
position, no significant pneumothorax. Developing infiltrate and/or
atelectasis right lung base. Pneumonia cannot be excluded. Mild infiltrate
the left lung base.

Dictation site 2

## 2012-11-02 IMAGING — CR DG CHEST 2V
1 series · 4 of 4 positions shown · non-contrast
Comparison: none

REASON FOR EXAM: Check Tubes
COMMENTS:

[Series 6: x chest ap · 0.14mm/px · 4 of 4 slices shown]
[im 1/4]
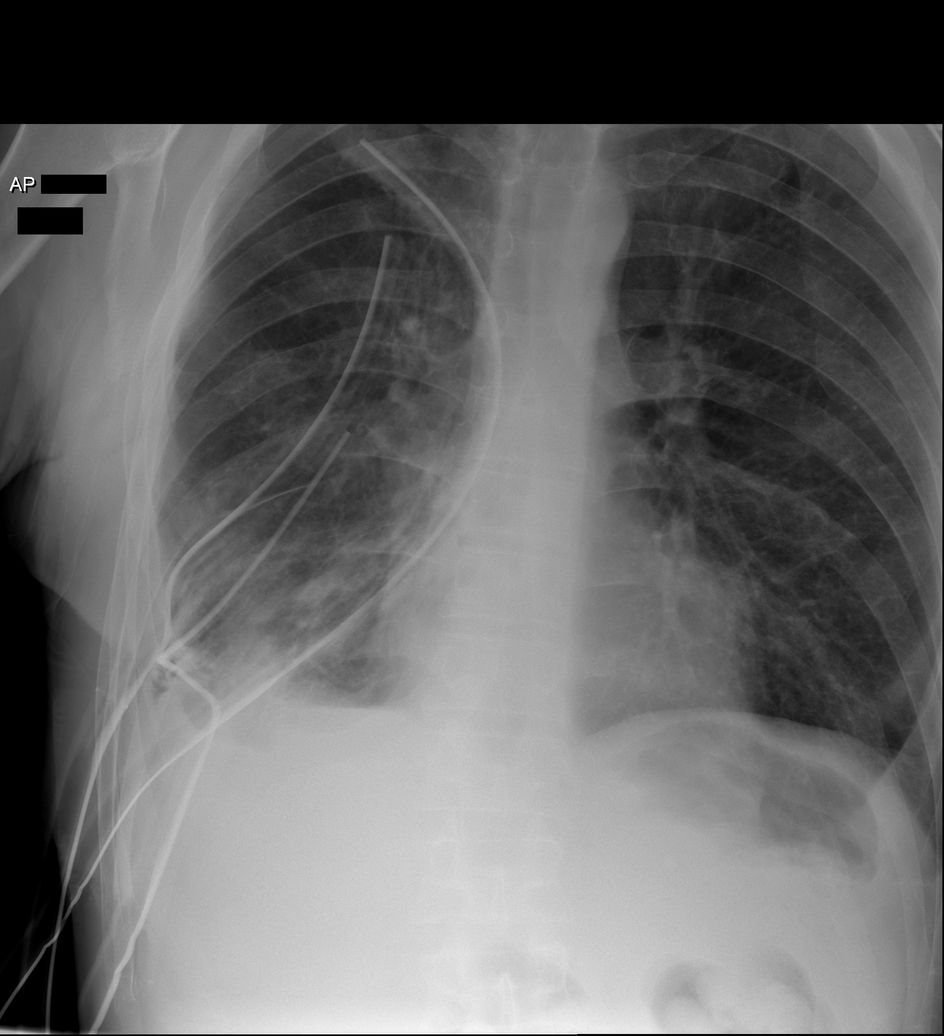
[im 2/4]
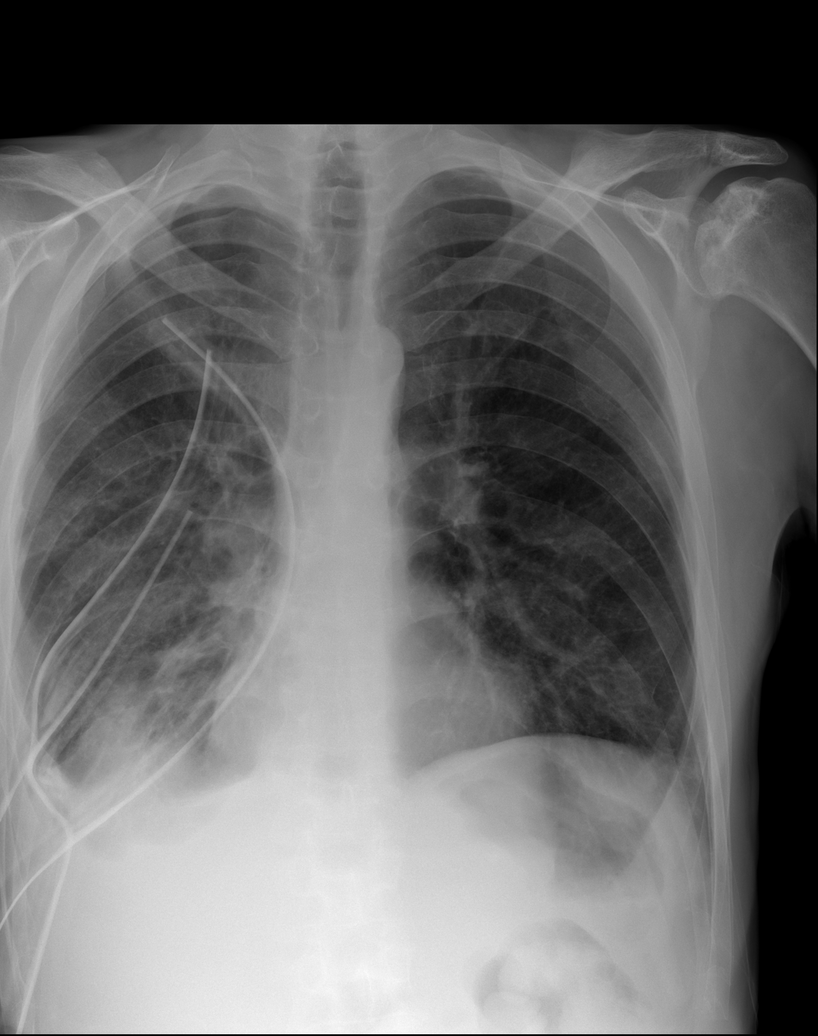
[im 3/4]
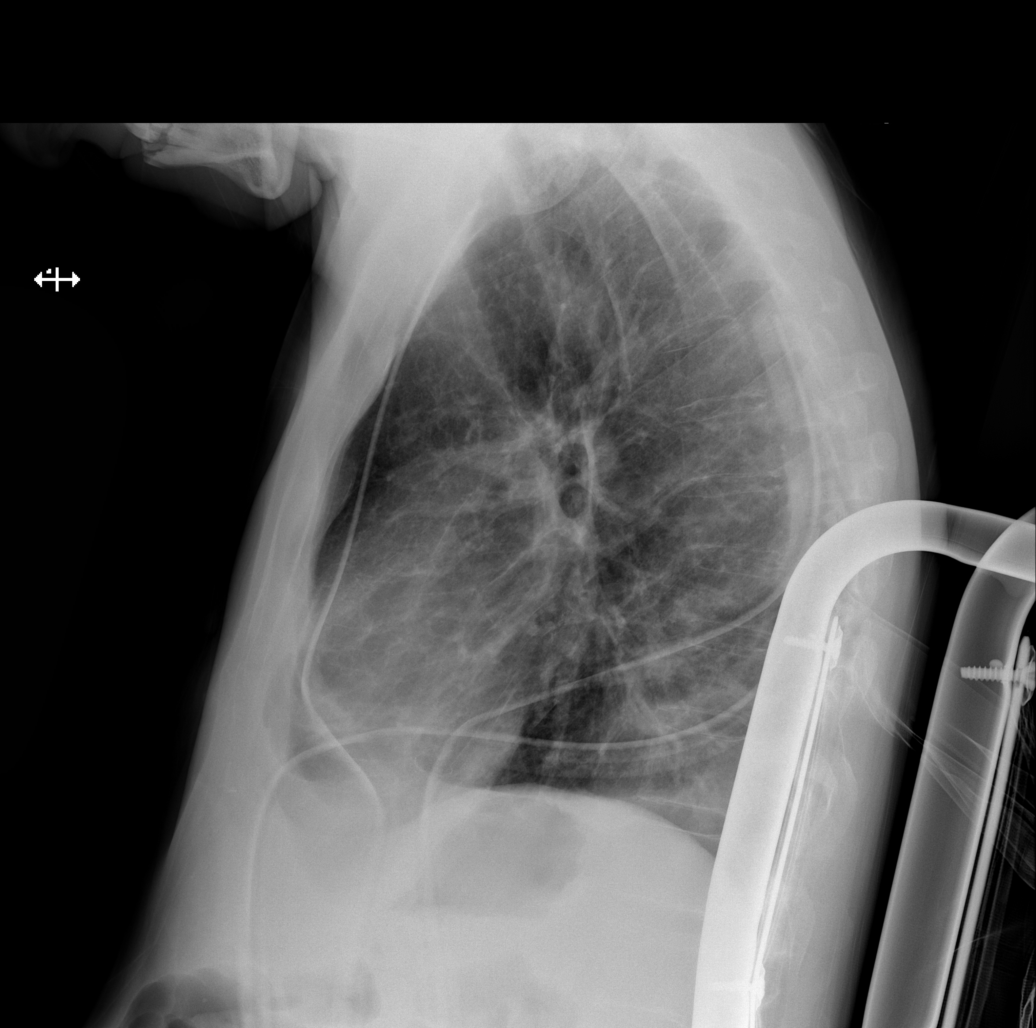
[im 4/4]
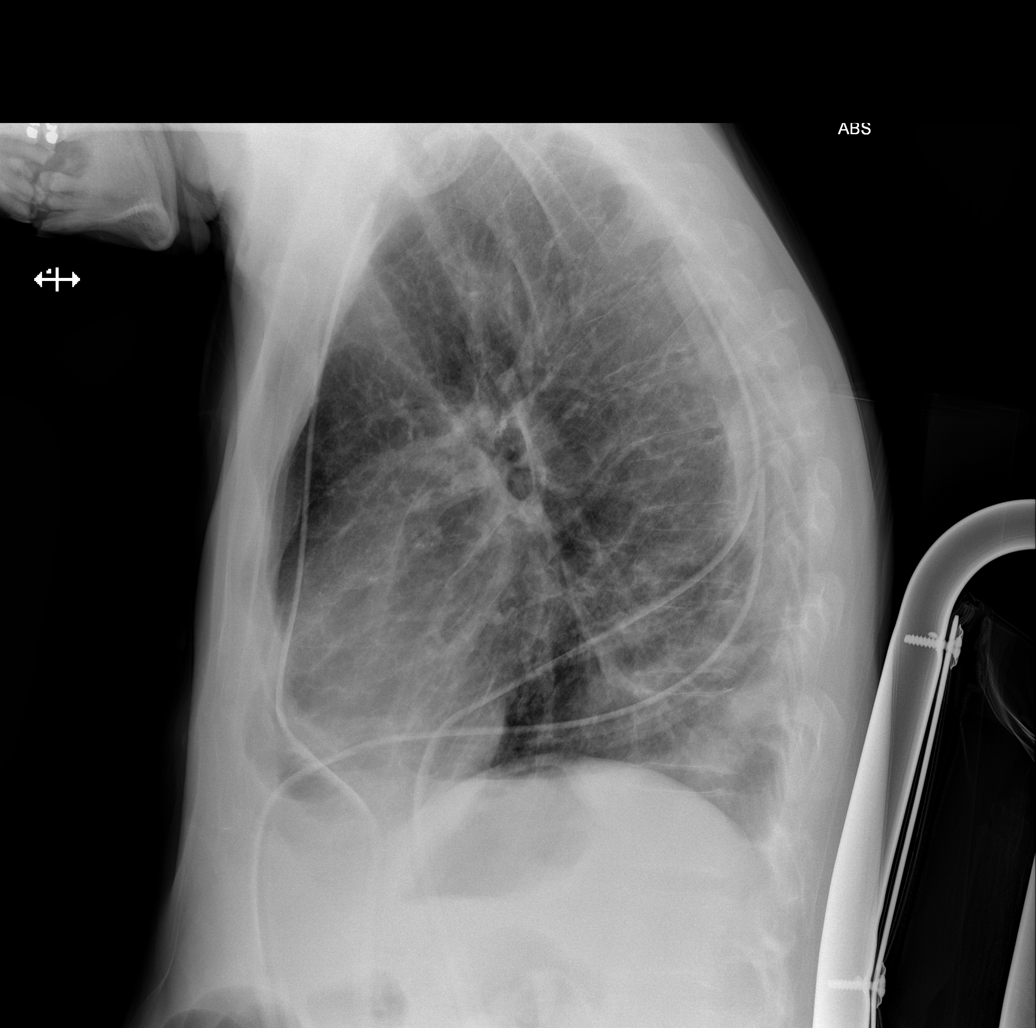

[4 of 4 positions shown; findings below may reference images not displayed]

PROCEDURE:     DXR - DXR CHEST PA (OR AP) AND LATERAL  - August 02, 2012  [DATE]

RESULT:     Three right-sided chest tube present. There is some minimal
right lung base atelectasis or contusion without significant effusion. No
pneumothorax is evident. The left lung appears clear. The heart size is
normal.
IMPRESSION: 3 right chest tubes remain in place.

[REDACTED]

## 2012-11-16 IMAGING — CR DG CHEST 2V
1 series · 3 of 3 positions shown · non-contrast
Comparison: none

REASON FOR EXAM: empyema
COMMENTS:

PROCEDURE:     DXR - DXR CHEST PA (OR AP) AND LATERAL  - August 16, 2012 [DATE]
RESULT:     Comparison: 08/13/2012

[Series 12: x chest ap · 0.14mm/px · 3 of 3 slices shown]
[im 1/3]
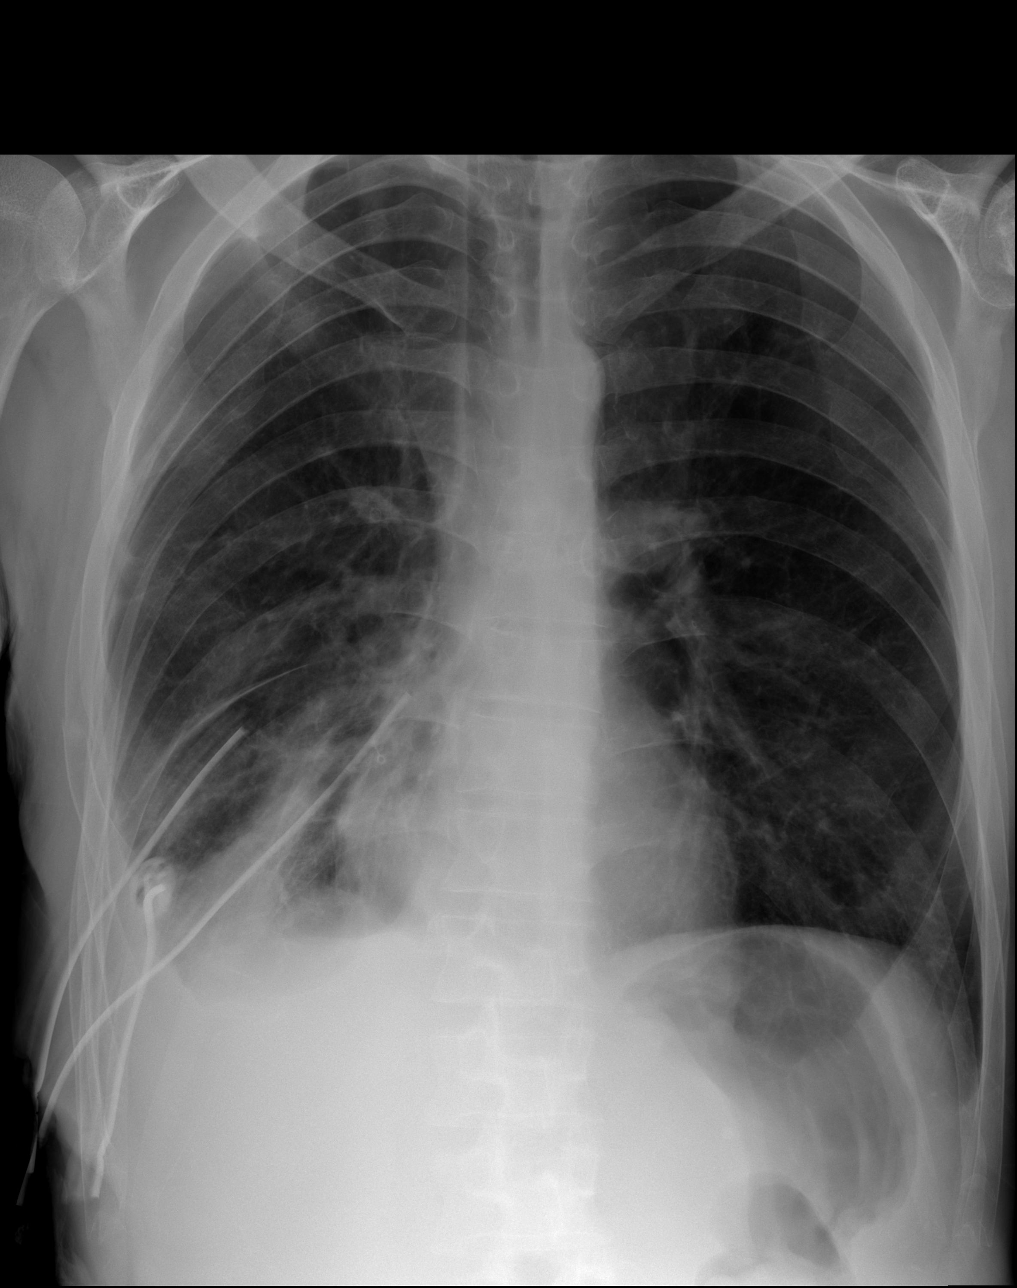
[im 2/3]
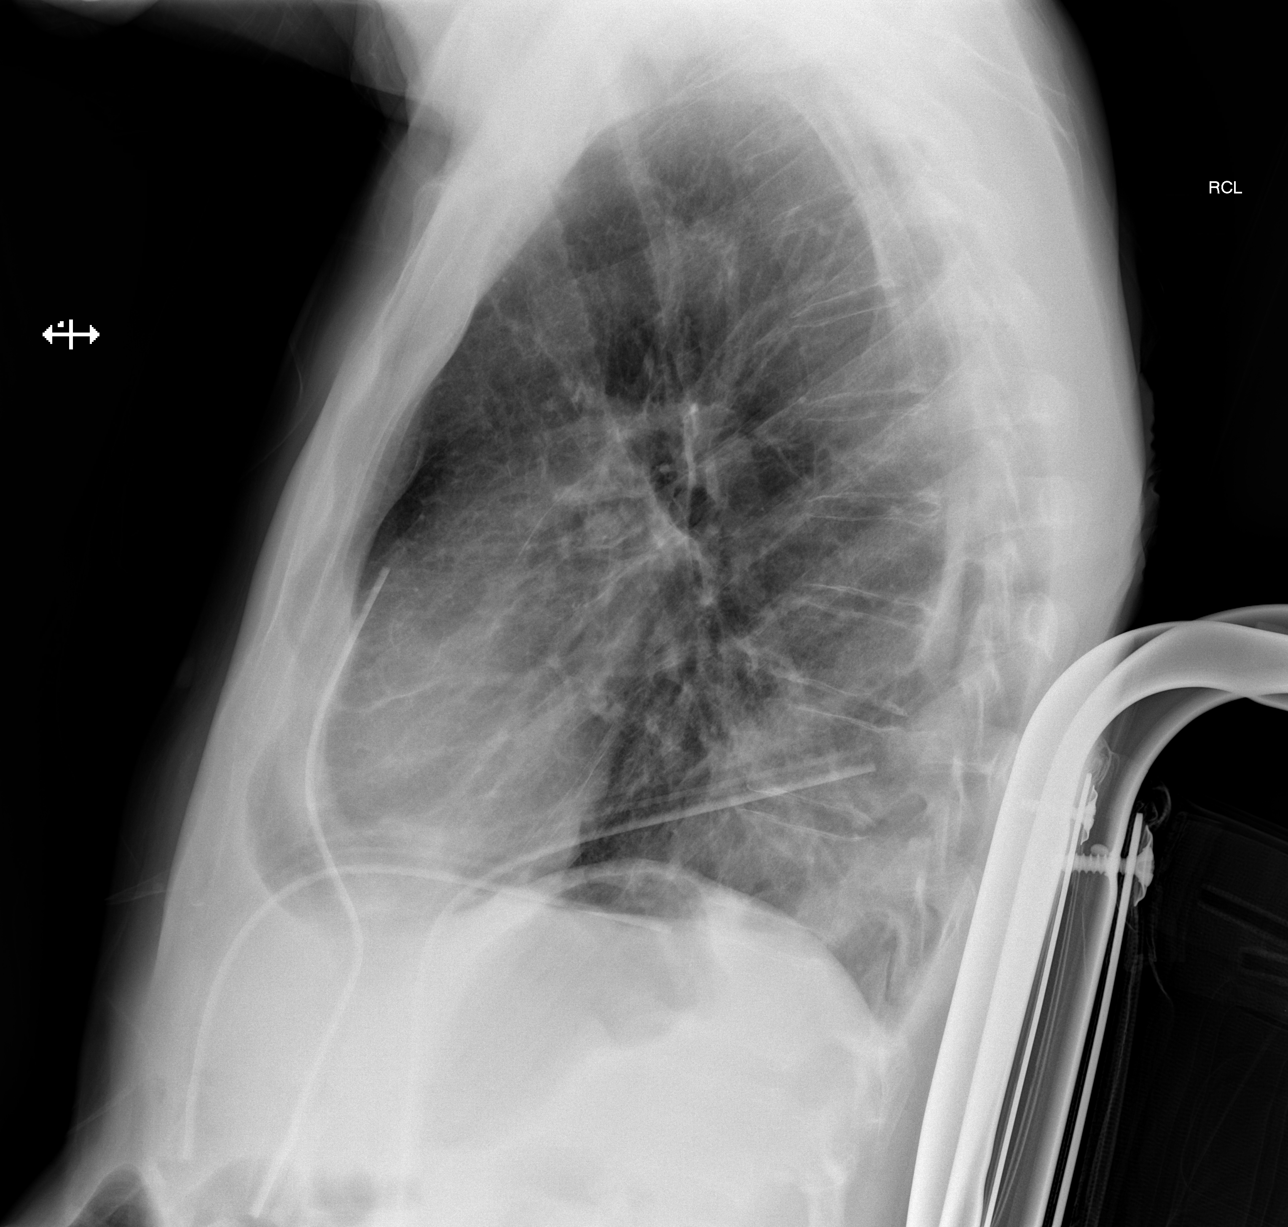
[im 3/3]
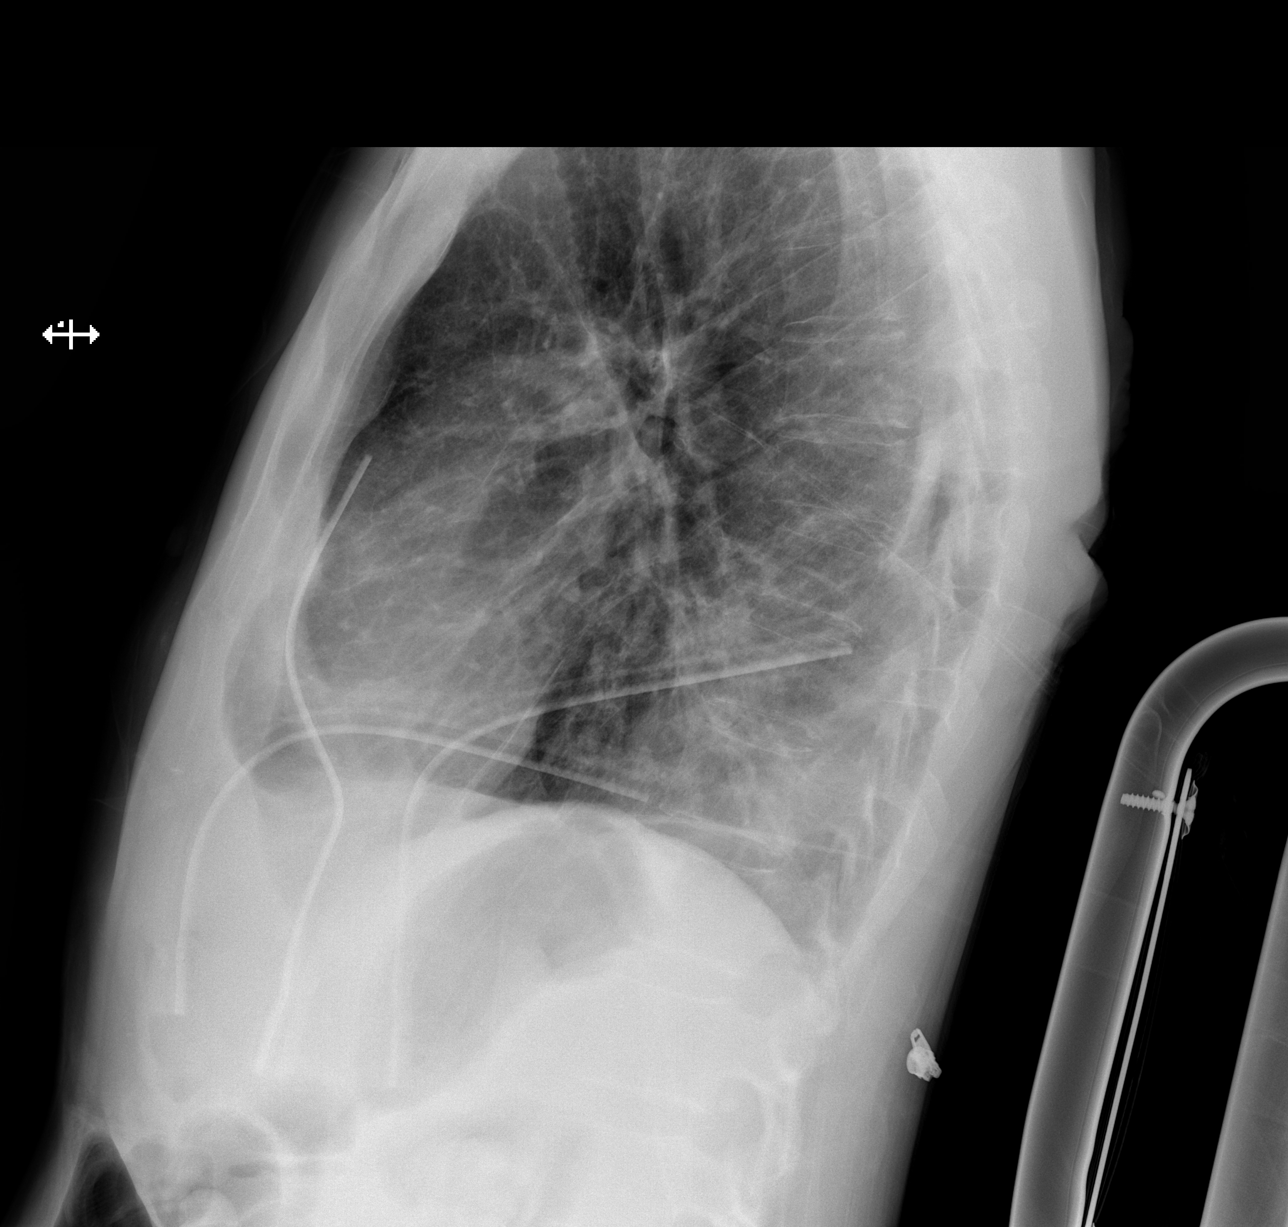

[3 of 3 positions shown; findings below may reference images not displayed]

FINDINGS: The heart and mediastinum are stable. There are 3 right-sided chest tubes.
There has been partial withdrawal of all 3 chest tubes. There is some
persistent opacity at the right lung base which may represent a combination
of atelectasis and pleural fluid. The left lung is clear. There may be a
small amount of pleural based air posteriorly on the lateral view.
IMPRESSION: 1. Persistent mild opacity at the right lung base which may present a
combination of pleural fluid and atelectasis.
2. All three chest tubes have been partially withdrawn.
3. There may be a small amount of pleural air posteriorly on the lateral
view.

[REDACTED]

## 2012-11-23 IMAGING — CR DG CHEST 2V
1 series · 3 of 3 positions shown · non-contrast
Comparison: none

REASON FOR EXAM: Empyema without mention of fistula
COMMENTS:

[Series 15: x chest ap · 0.14mm/px · 3 of 3 slices shown]
[im 1/3]
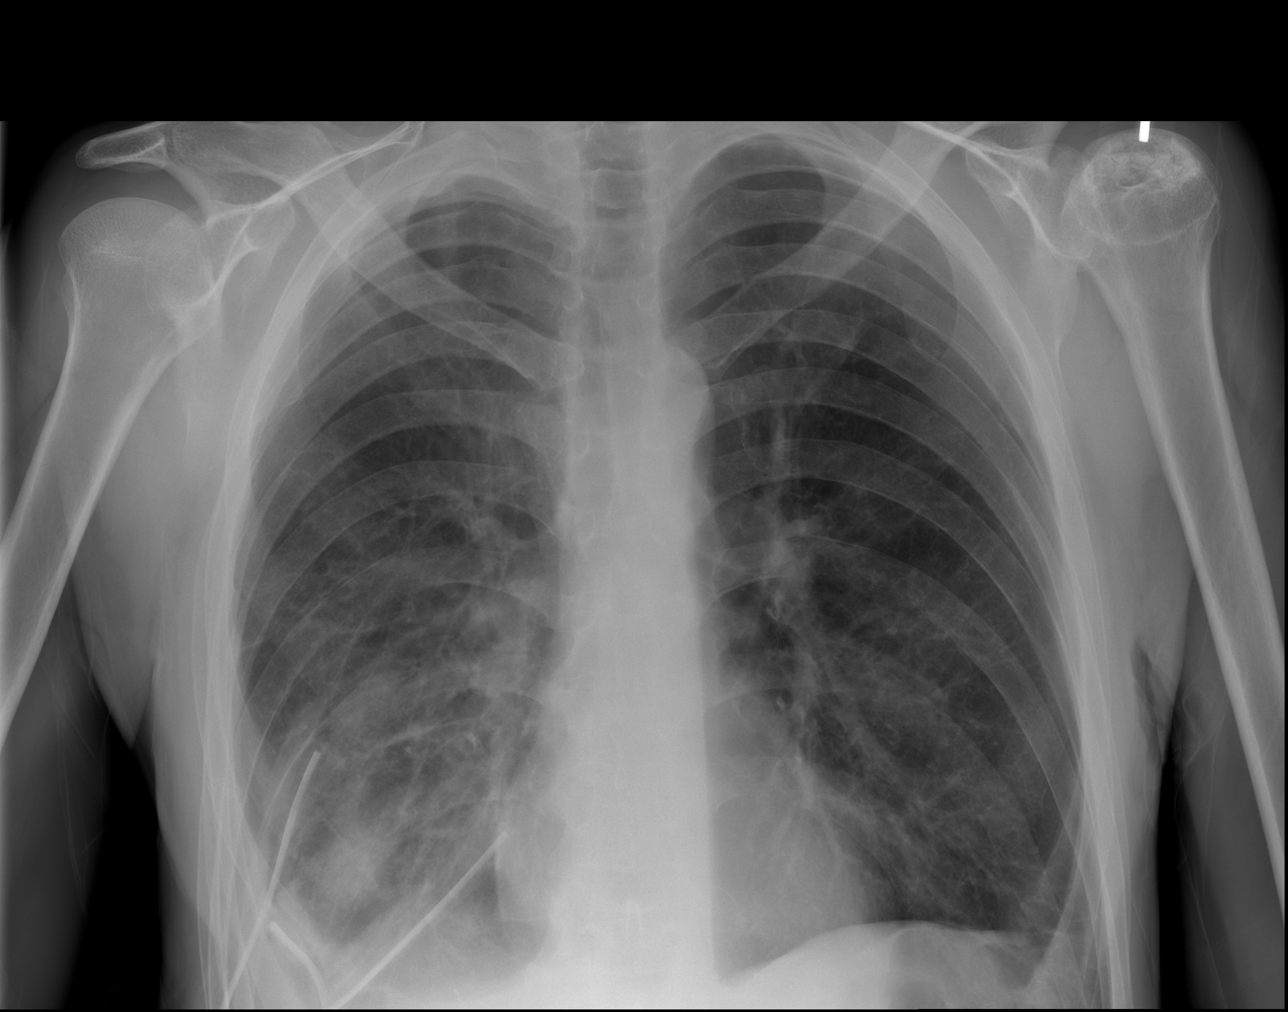
[im 2/3]
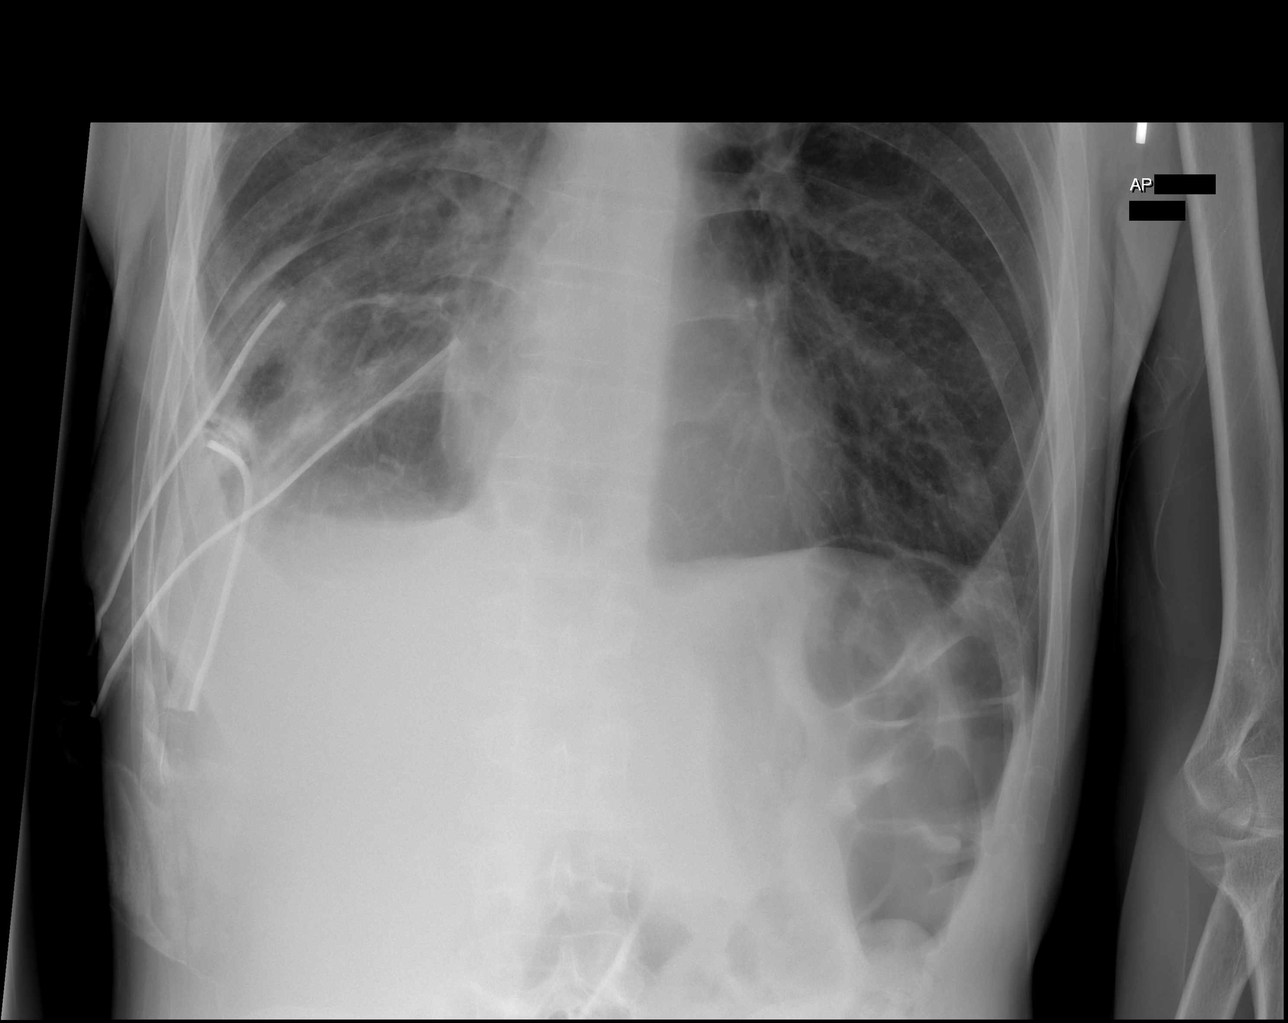
[im 3/3]
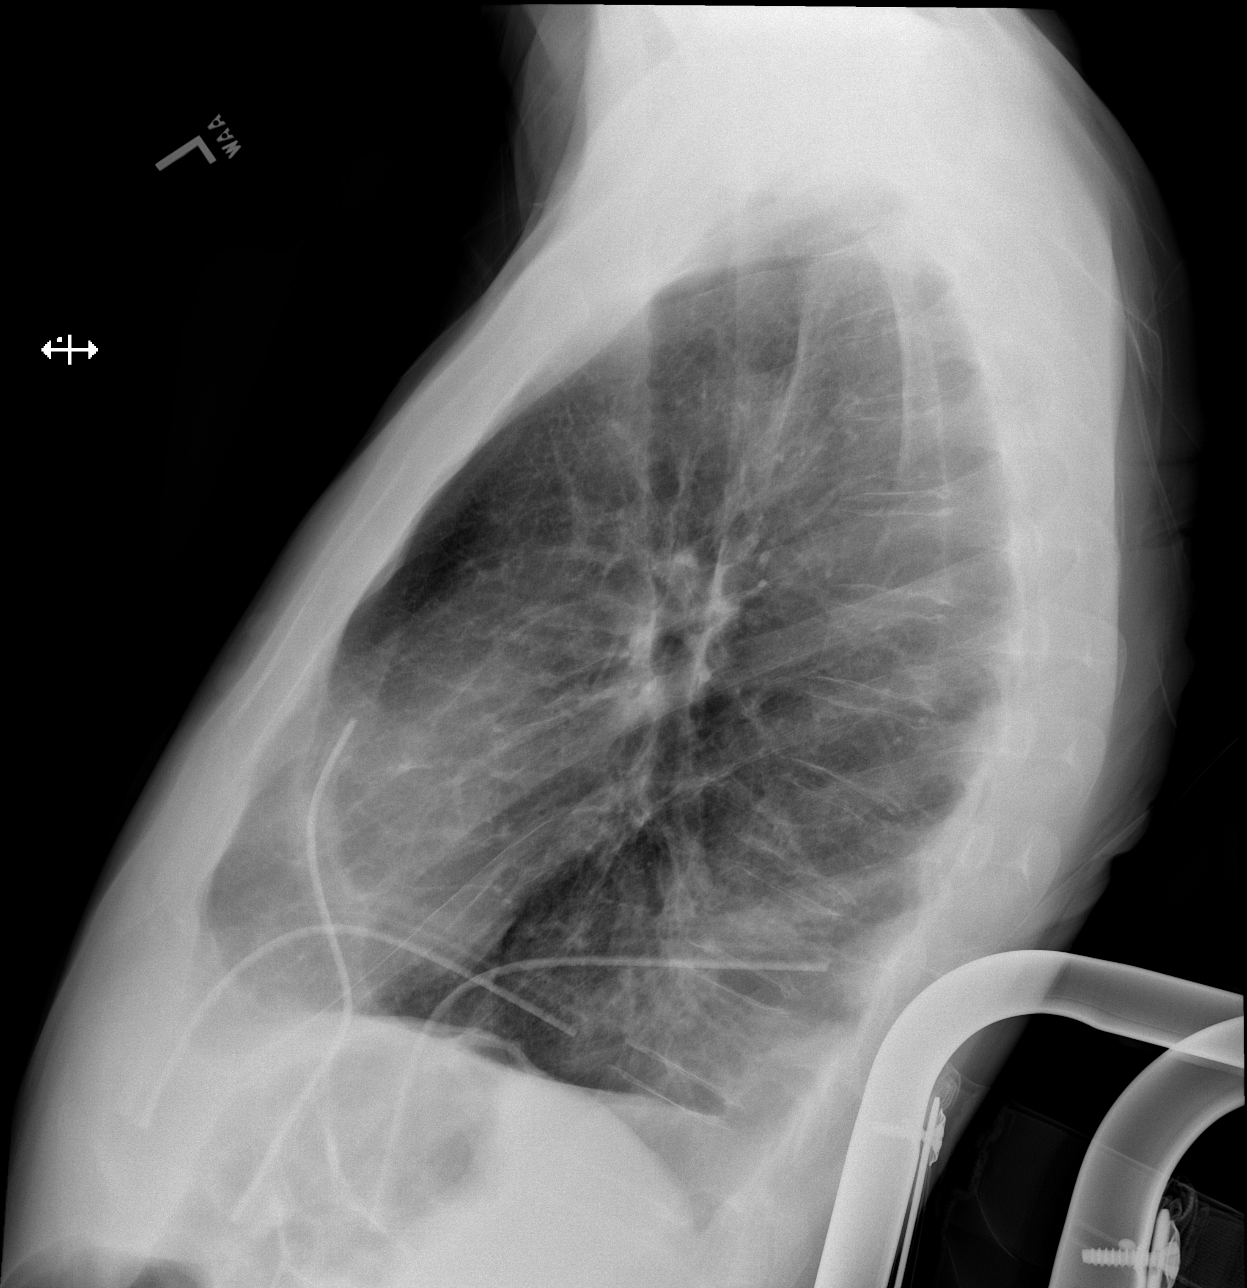

[3 of 3 positions shown; findings below may reference images not displayed]

PROCEDURE:     DXR - DXR CHEST PA (OR AP) AND LATERAL  - August 23, 2012  [DATE]

RESULT:     Comparison is made to the previous examination dated 16 August, 2012.

There persistent linear densities at the right lung base consistent with
drainage tubes. There is ill-defined increased density at the right lung
base which could represent pleural thickening, underlying pneumonia or
atelectasis. The lungs are otherwise clear. The heart and pulmonary vessels
are normal. The bony structures are unremarkable.
IMPRESSION: 1. Persistent increased density at the right lung base. Chest tubes remain
in place. CT followup may be beneficial.

[REDACTED]

## 2012-11-29 IMAGING — CR DG CHEST 2V
1 series · 2 of 2 positions shown · non-contrast
Comparison: none

REASON FOR EXAM: Chest Tubes
COMMENTS:

[Series 1: ap · 0.17mm/px · 2 of 2 slices shown]
[im 1/2]
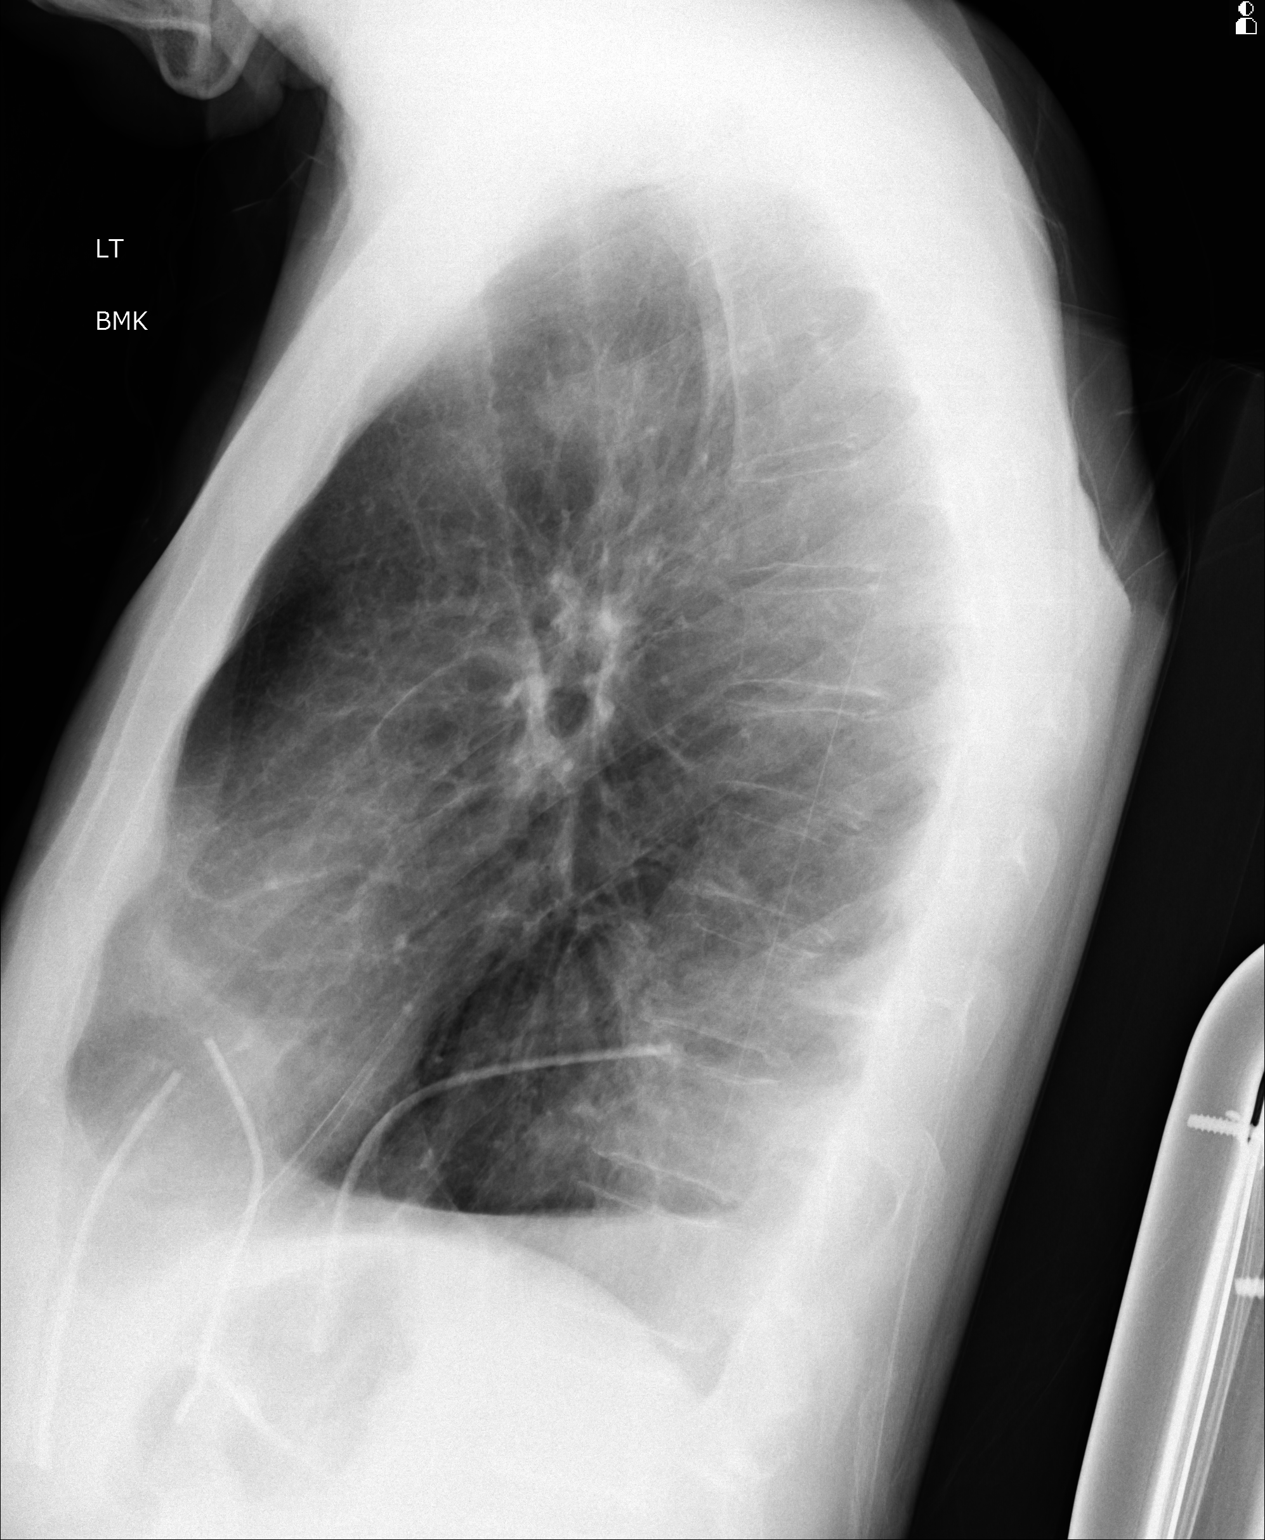
[im 2/2]
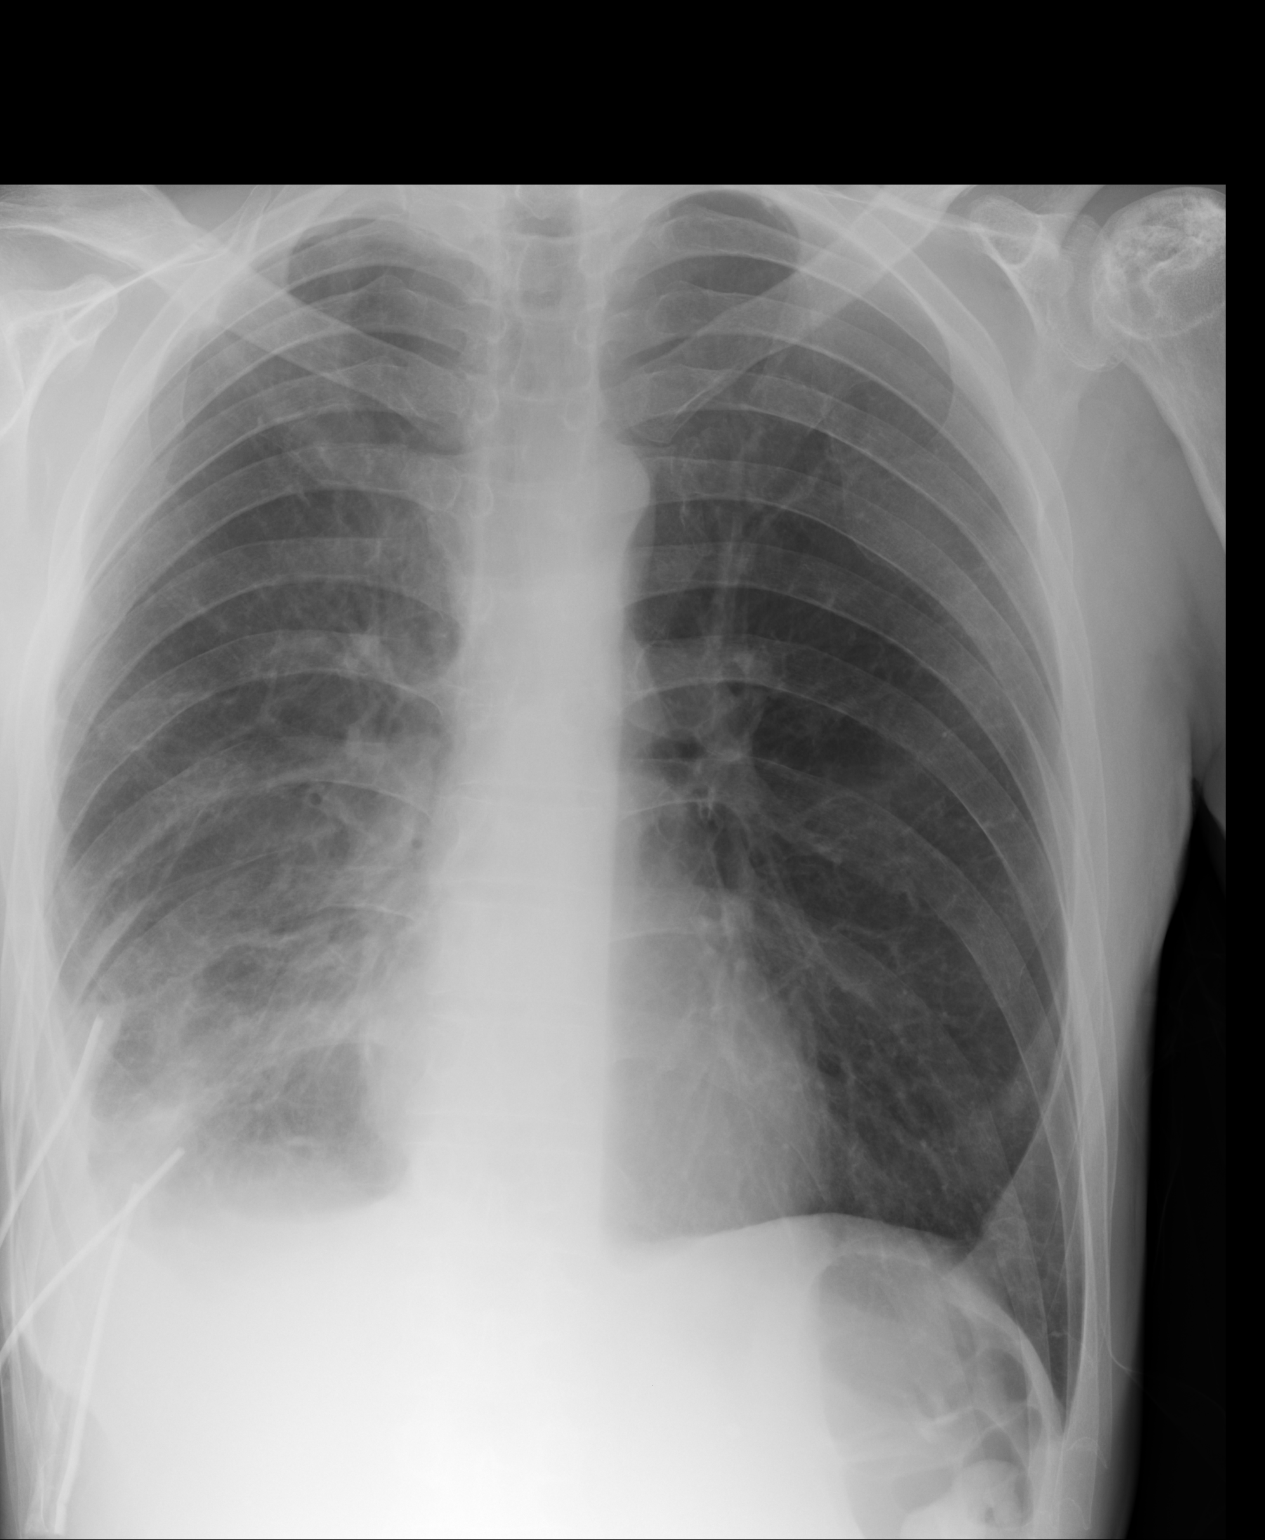

[2 of 2 positions shown; findings below may reference images not displayed]

PROCEDURE:     DXR - DXR CHEST PA (OR AP) AND LATERAL  - August 29, 2012  [DATE]

RESULT:     Comparison is made to the study 23 August, 2012.

Chest tubes remain present on the right. Hazy increased densities seen at
the right lung base. This could represent atelectasis or infiltrate. This is
most prominent posteriorly. Some pleural thickening or complex density of
other etiology including fluid is not excluded. Residual underlying
atelectasis or infiltrate could give a similar appearance. When compared
images and 23 August, 2012 the chest tubes are more retracted than on the
previous study. The heart size is normal. The left lung is hyperinflated.
The remaining mid upper portion of the right lung is clear.
IMPRESSION: The chest tubes are more retracted into the lung base
compared to the previous exam. Persistent density in the lower half of the
right hemithorax is noted seen anteriorly and posteriorly on the lateral
view. CT chest could be obtained for followup if clinically desired.

[REDACTED]

## 2012-12-26 DEATH — deceased

## 2015-04-14 NOTE — Discharge Summary (Signed)
PATIENT NAME:  [name] [name], [name] [name] MR#:  [id number] DATE OF BIRTH:  [birth year]  DATE OF ADMISSION:  06/26/2012 DATE OF DISCHARGE:  07/25/2012  DISPOSITION:  skilled nursing   DISCHARGE DIAGNOSES:  1. Acute hypoxic respiratory failure secondary to empyema status post decortication and chest tube placement.  2. Sepsis present on admission with empyema.  3. Chronic obstructive pulmonary disease exacerbation.  4. Nosocomial pneumonia.  5. Human immunodeficiency virus.  6. Severe anal condyloma.  7. Bipolar disorder.  8. Anemia of chronic disease.  9. History of constipation.   DISCHARGE MEDICATIONS:  1. Discontinue Coumadin due to anemia  2. Gabapentin 600 mg p.o. t.i.d.  3. Spiriva 18 mcg inhalation daily.  4. Celexa 20 mg daily. 5. Depakote 250 mg 2 tablets daily.  6. Atripla   1 tablet daily.  7. Tylenol 325 mg 2 tablets q.4-6 hours p.r.n.  8. Colace 100 mg p.o. b.i.d.  9. Seroquel 150 mg in the evening. 10. Symbicort 160/4.5, 2 puffs 2 times a day.  11. Atovaquone was changed to 10 mL once a day  ie 1500 mg daily,previous dose 750 mg  NEW MEDICATIONS: 1. Prednisone 50 mg daily.  2. KCl 40 mEq p.o. daily.  3. Albuterol with ipratropium inhalation through nebulizer as needed.  4. Augmentin 875 mg p.o. b.i.d. for two weeks.  5. Lactulose as needed for constipation. 6. Ranitidine 150 mg q.12 hours.  7. Patient has chest pain because of the chest tube so patient is needing pain medication including oxycodone 10 mg q.12 hours, oxycodone 5 mg q.4 hours p.r.n.   DIET: Regular diet.   NOTE: He will be discharged with right-sided chest tube and also 2 liters of oxygen.   FOLLOW UP: Patient is to follow up with Dr. Hulda Marinimothy Oaks in 1 to 2 days and Dr. Leavy CellaBlocker in 1 to 2 weeks.   Interim discharge dictation done by Dr. Auburn BilberryShreyang Patel on 07/26.   CONSULTATION: The same which is:  1. Dr. Hulda Marinimothy Oaks, cardiothoracic surgeon. 2. Dr. Casimiro NeedleMichael Blocker, infectious disease.  3. Dr. Julien Nordmannimothy Gollan,  cardiologist. 4. Dr. Meredeth IdeFleming, pulmonologist.  HOSPITAL COURSE: Patient's discharge summary remains the same and also patient has been off the Coumadin because of his blood loss from the chest tube. Patient's chest tube has been changed to small size w. Patient needs to be on this chest tube for five additional weeks and follow up with Dr. Thelma Bargeaks tomorrow. Regarding his pneumonia, he is also on antibiotics. So far he finished 18 days of Augmentin, he needs 10 days. According to ID patient needs total of four weeks. Patient will be going to <give script with Augmentin.   Patient has anal condyloma. Patient needs outpatient surgery evaluation . For his HIV he is continued on Mepron and Atripla.   Chest x-ray done on 07/29 showed patient has two right-sided chest tubes in satisfactory position. No significant pleural effusion and right basilar increased markings secondary to atelectasis or postsurgical changes. WBC yesterday showed 12.3, which is much better and improved from 20.7 on 0724 to yesterday 13.3, afebrile, and platelets stable at 321. Patient's kidney function stayed stable and no further issues at this time. Patient is stable for discharge with FULL CODE.   TIME SPENT: Time spent on discharge preparation, reviewing all charts is more than 30 minutes.   ____________________________ Katha HammingSnehalatha Joffre Lucks, MD sk:cms D: 07/25/2012 11:09:45 ET T: 07/25/2012 11:45:30 ET JOB#: [id number]  cc: Katha HammingSnehalatha Kassadee Carawan, MD, <Dictator> Katha HammingSNEHALATHA Monick Rena MD ELECTRONICALLY SIGNED 08/01/2012 14:16

## 2015-04-14 NOTE — Consult Note (Signed)
Brief Consult Note: Diagnosis: L nephrolithiasis with possible mild hydronephrosis.   Patient was seen by consultant.   Consult note dictated.   Discussed with Attending MD.   Comments: The patient is asymptomatic with regards to this 5 mm renal calculus and further evaluation or intervention is not indicated at this time.  Electronic Signatures: Orson ApeWolff, Michael R (MD)  (Signed 22-Jul-13 17:46)  Authored: Brief Consult Note   Last Updated: 22-Jul-13 17:46 by Orson ApeWolff, Michael R (MD)

## 2015-04-14 NOTE — Consult Note (Signed)
PATIENT NAME:  [name] [name], [name] [name] MR#:  [id number] DATE OF BIRTH:  [birth year]  DATE OF CONSULTATION:  07/16/2012  REFERRING PHYSICIAN:  Dr. Luberta MutterKonidena  CONSULTING PHYSICIAN:  Suszanne ConnersMichael R. Evelene CroonWolff, MD  REASON FOR CONSULTATION: Kidney stone.   HISTORY OF PRESENT ILLNESS: [name] [name] is a 54 year old Caucasian male admitted to the hospital with pneumonia and numerous systemic medical problems. During his evaluation he had a CT scan done which revealed a 5-mm left renal calculus with questionable mild hydronephrosis. The patient denied flank pain, hematuria, or past history of stone disease.   IMPRESSION: Asymptomatic 5-mm left renal stone.   SUGGESTIONS: Intervention or further evaluation for this asymptomatic small stone is not indicated at this time. The patient is instructed to follow up with his primary care physician should the stone cause flank pain or hematuria.   ____________________________ Suszanne ConnersMichael R. Evelene CroonWolff, MD mrw:bjt D: 07/16/2012 17:51:20 ET T: 07/17/2012 10:11:46 ET JOB#: [id number]  cc: Suszanne ConnersMichael R. Evelene CroonWolff, MD, <Dictator> Orson ApeMICHAEL R Amyr Sluder MD ELECTRONICALLY SIGNED 07/17/2012 15:08

## 2015-04-19 NOTE — Consult Note (Signed)
PATIENT NAME:  [name] [name], [name] [name] MR#:  [id number] DATE OF BIRTH:  Mar 13, 1961  DATE OF CONSULTATION:  06/26/2012  REFERRING PHYSICIAN:  Darrick MeigsSangeeta Panwar, MD  CONSULTING PHYSICIAN:  Sheppard Plumberimothy E. Hortensia Duffin, MD  REASON FOR CONSULTATION: Right-sided pleural effusion.   HISTORY OF PRESENT ILLNESS: I have been asked to see [name] [name] by Dr. Darrick MeigsSangeeta Panwar with whom I discussed the case. I was consulted for management of a right-sided pleural effusion. In addition, I have discussed his care with Cardiology and Anesthesia.   HISTORY OF PRESENT ILLNESS: [name] [name] is a 54 year old white male who is a resident in a nursing facility secondary to a stroke which he suffered after heroin and cocaine abuse. He states that for the last several weeks to even months he has been increasingly short of breath with cough, occasional low-grade fever, and some greenish sputum production. He states that he has been in this condition for the last several months but just recently came to the Emergency Department. He had a chest x-ray made which revealed a large right-sided pleural effusion. This was followed by chest CT scan. Chest CT revealed mediastinal adenopathy with evidence of a large right-sided loculated pleural effusion.   [name] [name] has an extensive history. This will be outlined below. He does have COPD. He states that he has been a lifelong smoker since the age of 54 or [age]. Most recently he was smoking cigars. He is essentially bedridden but can get into a wheelchair for mobilization. He does not walk.   He states that he has had some weight loss over the last six months. He lost approximately 20 pounds during that timeframe. He states he has a very poor appetite.   PAST MEDICAL HISTORY:  1. History of HIV. He sees Dr. Casimiro NeedleMichael Blocker for that.  2. History of chronic obstructive pulmonary disease. 3. History of coronary artery disease.  4. He states that in the year 2000 he was diagnosed as having [facility] but nothing further was done for it.  5. History of a stroke with resultant left hemiparesis.  6. He had a pulmonary embolism which was diagnosed in the year 2004 and he has been on chronic anticoagulation since then.  7. History of a bipolar manic depressive illness and history of suicide ideations.   8. History of polysubstance abuse including heroin and cocaine.  9. History of anal condylomata.   10. History of ongoing tobacco abuse.   FAMILY HISTORY: His mother died of COPD. His father had a stroke and myocardial infarction. There is no other family history of lung disease.   SOCIAL HISTORY: He is a chronic smoker. He is divorced. He does not use alcohol at the present time although he did in the past. 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   REVIEW OF SYSTEMS: As per history of present illness and all other review of systems were asked and were negative.   PHYSICAL EXAMINATION:   GENERAL: Elderly-appearing gentleman who appeared older than his stated age. He was inspiring nasal cannula oxygen. He did not appear to be dyspneic. He was able to speak in complete sentences. His pupils were equal. His oropharynx was clear.   NECK: Supple without thyromegaly or adenopathy.   LUNGS: His lungs showed diminished breath sounds on the right.   HEART: His heart was regular. There were no murmurs.   ABDOMEN: Soft, nontender, nondistended. There were no palpable masses. There was no hepatosplenomegaly.  EXTREMITIES: His extremities revealed no clubbing, cyanosis, or edema. He had innumerable tattoos. He did have some weakness in his lower extremities. He had good movement in his upper extremities.   ASSESSMENT AND PLAN: I have independently reviewed the patient's CT scan. It does appear that he has a chronic right-sided pleural effusion. There are some scattered mediastinal nodes. I do not appreciate any mass in the left lung. I have reviewed his laboratory studies. I  also discussed his care with Dr. Dava Najjar tonight. We will go ahead and correct his coags. We will give him some Vitamin K and fresh frozen plasma. We will hold on his Coumadin at this time. Will ask Cardiology to see him in the morning for consideration of any additional work-up. His echo did reveal normal left ventricular function without any valvular heart disease.   I did review with the patient in great detail the indications and risks of right thoracoscopy, probable thoracotomy for decortication. I explained to him the risks would include bleeding, infection, air leak, mechanical ventilation, and death. After an extensive discussion with the patient, he has agreed to proceed. He has no immediate family for me to discuss his case with nor does he desire that.   Thank you very much for allowing me to participate in his care.   ____________________________ Sheppard Plumber. Thelma Barge, MD teo:drc D: 06/26/2012 18:02:55 ET T: 06/27/2012 08:49:54 ET JOB#: [id number]  cc: Marcial Pacas E. Thelma Barge, MD, <Dictator> Jasmine December MD ELECTRONICALLY SIGNED 07/04/2012 9:42

## 2015-04-19 NOTE — Consult Note (Signed)
General Aspect 54 year old Caucasian male with history of CAD, MI by report in 1996 and 2000, HIV positive, history of COPD and ongoing tobacco abuse x 40 years, history of pulmonary embolism, and history of cerebrovascular accident with left hemiparesis, presenting with shortness of breath which is getting worse with cough with greenish sputum production. Cardiology was consulted for preop evaluation prior to thoracotomy for empyema and mass, h/o CAD and elevated BNP.  He reports some fever and chills.  He  failed outpatient treatment with prednisone. CXR showed significant   consolidation of his right lung. he denies any recent chest pain apart from a tightness that started when his infection started, with lots of coughing. He is predominantly wheelchair bound. No significant cardiac workup recently as he reports no symptoms. Typically he is relatively active.  Unable to ambulate secondary to residual deficit on the left from old CVA.    Present Illness . FAMILY HISTORY: His father suffered from COPD. Negative family history for premature coronary artery disease or stroke.   SOCIAL HABITS: Chronic smoker. He continues to smoke about four cigars a day. He used to smoke cigarettes in the past. He started smoking at age of 54. 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 The patient denies.   Physical Exam:   GEN well developed, well nourished, no acute distress    HEENT red conjunctivae    NECK supple    RESP postive use of accessory muscles  wheezing  rhonchi  crackles    CARD Regular rate and rhythm  No murmur    ABD denies tenderness  soft    LYMPH negative neck    SKIN normal to palpation    NEURO motor/sensory function intact    PSYCH alert, A+O to time, place, person, good insight   Review of Systems:   Subjective/Chief  Complaint SOB, cough    General: Fatigue  Fever/chills  Weakness    Skin: No Complaints    ENT: No Complaints    Eyes: No Complaints    Neck: No Complaints    Respiratory: Frequent cough  Short of breath  Wheezing  Sputum    Cardiovascular: Dyspnea    Gastrointestinal: No Complaints    Genitourinary: No Complaints    Vascular: No Complaints    Musculoskeletal: No Complaints    Neurologic: left side deficits    Hematologic: No Complaints    Endocrine: No Complaints    Psychiatric: No Complaints    Review of Systems: All other systems were reviewed and found to be negative    Medications/Allergies Reviewed Medications/Allergies reviewed     rhabdomyalisis:    multiple falls: pt had experienced multiple falls prior to admission at the [facility]   HIV +:    Pneumonia - PCP type:    GERD - Esophageal Reflux:    HTN:    Pulmonary Embolus: 2004   personality disorder:    Bipolar Disorder:    multiple falls at home on Nov 23, 2008 prior to admission:    COPD:    Depression:    MI - Myocardial Infarct:    Emphysema:    Aids:    substance abuse:    anal mass:    Fall: 07-Dec-2008  Home Medications: Medication Instructions Status  gabapentin 600 mg oral tablet 1 tab(s) orally 3 times  a day Active  Spiriva 18 mcg inhalation capsule 1 each inhaled once a day Active  citalopram 20 mg oral tablet 1 tab(s) orally once a day Active  divalproex sodium 250 mg oral tablet, extended release 2 tab(s) orally once a day (at bedtime) Active  Atripla oral tablet 1 tab(s) orally once a day Active  Tylenol 325 mg tablet 2 tab(s) orally every 4-6 hours PRN   Active  Coumadin 5 mg oral tablet 1 tab(s) orally 2 times a day  Active  Mepron 750 mg/5 mL oral suspension 5 milliliter(s) orally once a day Active  docusate sodium 100 mg oral tablet 1 tab(s) orally 2 times a day Active  warfarin 6 mg oral tablet 1 tab(s) orally once a day Active  quetiapine 150 mg oral  tablet, extended release 1 tab(s) orally once a day (in the evening) Active  diphenhydrAMINE 25 mg oral capsule cap(s) orally once a day (at bedtime), As Needed Active  Symbicort 160 mcg-4.5 mcg/inh inhalation aerosol 2 puff(s) inhaled 2 times a day Active   EKG:   Interpretation EKG shows NSR with rate 94 bpm, abn T waves V1 to V4 (old, seen in 2011)    Effexor: Other  Haldol: Other  Depakote: Unknown  Sulfa drugs: Unknown  Vital Signs/Nurse's Notes: **Vital Signs.:   03-Jul-13 02:45   Vital Signs Type Blood Transfusion Complete    03:45   Vital Signs Type 1 hr Post Blood   Temperature Temperature (F) 99.2   Celsius 37.3   Temperature Source oral   Pulse Pulse 80   Respirations Respirations 18   Systolic BP Systolic BP 118   Diastolic BP (mmHg) Diastolic BP (mmHg) 71   Mean BP 86   Pulse Ox % Pulse Ox % 93   Oxygen Delivery 3L     Impression 54 year old Caucasian male with history of CAD, MI by report in 1996 and 2000, HIV positive, history of COPD and ongoing tobacco abuse x 40 years, history of pulmonary embolism, and history of cerebrovascular accident with left hemiparesis, presenting with shortness of breath which is getting worse with cough with greenish sputum production.  1) Preop right thoracoscopy possible thoracotmy for decortication.  CXR with possible mass Elevated BNP likely from underlying lung disease, mild right heart stretch, possible contribution from previous PEs.  Likely a chronic issue and not a sign of significant heart failure. no clinical signs of heart failure on exam or echo (read by Dr. Welton Flakes) --Certainly has risk of additional cardiac events given long smoking hx, known CAD (details unavailable from 2000). --We do nopt have the time to perform a long workup and suggest we proceed with surgical procedure.  No recent chest pain, reasonable level of activity before infection. No recent angina. --Still, at least moderate to high risk. --would  probably not use b-blockers periop to avoid bronchospasm.  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

## 2015-04-19 NOTE — Consult Note (Signed)
Brief Consult Note: Diagnosis: proable right empyema.   Patient was seen by consultant.   Consult note dictated.   Recommend to proceed with surgery or procedure.   Recommend further assessment or treatment.   Orders entered.   Discussed with Attending MD.   Comments: Likely empyema.  Reviewed indications and risks of right thoracoscopy possible thoracotmy for decortication.  Will ask Cardiology to see regarding his elevated BNP (echo normal).  Plan for surgery tomorrow if we can correct coags.  Electronic Signatures: Jasmine Decemberaks, Bannie Lobban E (MD)  (Signed 02-Jul-13 17:34)  Authored: Brief Consult Note   Last Updated: 02-Jul-13 17:34 by Jasmine Decemberaks, Nimesh Riolo E (MD)

## 2015-04-19 NOTE — Discharge Summary (Signed)
PATIENT NAME:  [name], Dariusz MR#:  [id number] DATE OF BIRTH:  1961/05/24  DATE OF ADMISSION:  12/26/2011 DATE OF DISCHARGE:  12/31/2011  ADDENDUM: His INR today is 1.1. The Coumadin was restarted back yesterday, so he should have a follow-up INR on Monday at Dr. Sharrell KuBlocker's office.   ____________________________ Fredia SorrowAbhinav Verleen Stuckey, MD ag:cbb D: 12/31/2011 13:43:29 ET T: 01/02/2012 11:38:35 ET JOB#: [id number]  cc: Fredia SorrowAbhinav Takhia Spoon, MD, <Dictator> Rosalyn GessMichael E. Blocker, MD Fredia SorrowABHINAV Matson Welch MD ELECTRONICALLY SIGNED 01/13/2012 12:27

## 2015-04-19 NOTE — Consult Note (Signed)
Impression: 54yo WM w/ h/o AIDS (CD4 count of 225, undetectable viral load), COPD, DVT/PE admitted with empyema.  His CT shows what appears to be a loculated fluid collection.  His CD4 count has been around 200 for the past few years.  This is unlikely to be an atypical organism, but the extent of the infection may be affected by his immunosuppression. Thoracic surgery to see him for probable chest tube placement. Continue zosyn and vanco until pleural fluid cultures return. Continue mepron for PCP prophylaxis. Continue HAART. Will put duoderm on his sacral ulcer. He has multiple anal condylomata.  He is at risk for anal cancer.  Will consider asking general surgery to see him while in house.  Electronic Signatures: Amariyah Bazar, Rosalyn GessMichael E (MD)  (Signed on 02-Jul-13 16:31)  Authored  Last Updated: 02-Jul-13 16:31 by Gearld Kerstein, Rosalyn GessMichael E (MD)

## 2015-04-19 NOTE — Consult Note (Signed)
PATIENT NAME:  [name] [name], [name] [name] MR#:  [id number] DATE OF BIRTH:  08/23/1961  DATE OF CONSULTATION:  12/26/2011  REFERRING PHYSICIAN: Camillo FlamingKamran Lateef, MD  CONSULTING PHYSICIAN:  Rosalyn GessMichael E. Alayzha An, MD  REASON FOR CONSULTATION: Shortness of breath and wheezing in an HIV patient.   HISTORY OF PRESENT ILLNESS: The patient is a 54 year old white man with a past history significant for AIDS with a CD4 count of 196 as of 11/24/2011, chronic obstructive pulmonary disease, pulmonary embolism and polysubstance abuse, who was admitted today with a one day history of increasing shortness of breath and wheezing. The patient states that prior to yesterday he was in his usual state of health and over the last 24 hours he has had increasing shortness of breath and wheezing. He has not had significant cough or sputum production. He has not had significant upper respiratory symptoms of congestion or nasal congestion, or sinus congestion. He has had some dryness in his throat, but no frank soreness. He has not noted any swollen glands. He states that he continues to smoke, but is down to five cigarettes per day. He has not had any recent cocaine use or alcohol. He has not been on PCP prophylaxis. He states that he has been taking his Atripla however. He is currently on levofloxacin and PCP treatment doses of atovaquone. He was given high-dose steroids as well.   ALLERGIES: Depakote, Effexor, Haldol, and sulfa drugs.   PAST MEDICAL HISTORY:  1. HIV infection. The patient has a history of noncompliance in the distant past, but since living in a group home he has been better with compliance. His CD4 count on 11/24/2011 was 196 (7%) with an undetectable viral load. His CD4 counts in the past have been oftentimes less than 200 and he has been on and off PCP prophylaxis in the past.  2. Chronic obstructive pulmonary disease.  3. Pulmonary embolism.  4. Myocardial infarction in 1996 and in 2000. Catheterization showed a myocardial  bridge.  5. Peripheral neuropathy. The patient had been nonambulatory for quite a while, but recently had been working with physical therapy and has been ambulating with a walker more recently.  6. Cocaine abuse. He has previously used crack cocaine, but quit remotely.  7. Heavy alcohol abuse. He has remotely quit with his last relapse in February 2012.    SOCIAL HISTORY: The patient lives in a group home. He smokes less than a pack of cigarettes per day. He had past crack cocaine abuse problem, but has not use cocaine in quite a while. He has a past alcohol abuse problem, but has not been drinking for the last 10 months.   FAMILY HISTORY: Positive for throat cancer.   REVIEW OF SYSTEMS: GENERAL: No fevers, chills or sweats. Positive malaise and fatigue. HEENT: No headaches. No sinus congestion. No nasal congestion. No sore throat. Some throat dryness. NECK: No swollen glands. No stiffness. RESPIRATORY: No cough. No sputum production. Positive shortness of breath and wheezing. CARDIAC: No chest pains. No palpitations, no peripheral edema. GASTROINTESTINAL: Some nausea on occasion. No vomiting. No abdominal pain. No change in his bowels. GU: No complaints. MUSCULOSKELETAL: He has generalized lower extremity weakness, but has been doing better with physical therapy and has been ambulating with a walker. SKIN: He has several contusions over his face having recently fallen out of his wheelchair. NEUROLOGIC: The patient has weakness in the lower extremities as described above. PSYCHIATRIC: No current complaints. All other systems are negative.   PHYSICAL EXAMINATION:  VITAL SIGNS:  Temperature 98.0, pulse 100, blood pressure 123/71, 95% sats.   GENERAL: This is a 54 year old white man in no acute distress.   HEENT: Normocephalic. Evidence for recent trauma with a laceration over the left eyebrow and ecchymosis around the right eye. Pupils equal and reactive to light. Extraocular motion intact. Sclerae,  conjunctivae, and lids are without evidence for emboli or petechiae. Oropharynx shows no erythema or exudate. Teeth and gums are in fair condition.   NECK: Supple. Full range of motion. Midline trachea. No lymphadenopathy. No thyromegaly.   CHEST: Diffuse wheezing throughout. He had decreased air movement bilaterally. He had no focal consolidation, however.   CARDIAC: Regular rate and rhythm without murmur, rub, or gallop.   ABDOMEN: Soft, nontender, and nondistended. No hepatosplenomegaly. No hernias noted.   EXTREMITIES: No evidence for tenosynovitis.   SKIN: Other than the ecchymoses and lacerations of the face, there were no other rashes noted. There were no stigmata of endocarditis, specifically no Janeway lesions nor Osler nodes.   NEUROLOGIC: The patient was awake and interactive, moving all four extremities.   PSYCHIATRIC: Mood and affect appeared normal.   LABORATORY, DIAGNOSTIC, AND RADIOLOGICAL DATA: BUN 10, creatinine 0.51, bicarbonate 22, anion gap 8. White count 17.8 with a hemoglobin 15.1, platelet count 162, ANC 14.1. LFTs were within normal limits. Urinalysis was unremarkable. An ABG showed 7.51/30/67//94.9% on room air. A chest x-ray showed no acute infiltrates.   IMPRESSION: A 54 year old white man with a past history significant for AIDS with a CD4 count of 196 as of 10/2011, chronic obstructive pulmonary disease, pulmonary embolism and substance abuse admitted with shortness of breath and wheezing.   RECOMMENDATIONS:  1. On exam, he has significant wheezing. He has not had much sputum production. He has not been on PCP prophylaxis. His CD4 count does put him at risk for PCP. Typically, this would be a more subacute process, but it is possible. Chronic obstructive pulmonary disease exacerbation is another possibility.  2. I agree with PCP therapy and steroids. He is getting high-dose steroids for his chronic obstructive pulmonary disease. The concern would be if this is  PCP and his PCP specific therapy is stopped, he could get much worse with steroids without PCP treatment.  3. Will continue the levofloxacin for routine antimicrobial coverage. This will likely affect his INR. He has been very difficult to get his INR to stay within therapeutic range due to his diet. Will need to follow his INR in house.  4. Would continue the Atripla.  5. I agree with pulmonary consult. He may need a bronchoscopy to rule out PCP.   This is a high-level infectious disease consult. Thank you very much for involving me in Mr. Starner care.   ____________________________ Rosalyn Gess. Teddi Badalamenti, MD meb:ap D: 12/26/2011 15:42:41 ET             T: 12/26/2011 16:29:21 ET             JOB#: [id number] cc: Rosalyn Gess. Mackensey Bolte, MD, <Dictator>  Kyshawn Teal E Koven Belinsky MD ELECTRONICALLY SIGNED 12/28/2011 8:57

## 2015-04-19 NOTE — Discharge Summary (Signed)
PATIENT NAME:  [name], [name] [name] MR#:  [id number] DATE [name] [name] [name], [name] [name] [name]  February 11, 1961  DATE OF ADMISSION:  12/26/2011 DATE OF DISCHARGE:  12/31/2011  DISCHARGE DIAGNOSES:  1. Acute hypoxic respiratory failure secondary to bilateral pneumonia, chronic obstructive pulmonary disease exacerbation, also suspect pneumocystic pneumonia. 2. HIV positive on highly active antiretroviral therapy. 3. History of coronary artery disease. 4. History of pulmonary embolism, on Coumadin.  5. Bipolar disorder.  6. History of cerebrovascular accident with left hemiparesis.  7. History of suicidal attempt in the past. 8. History of substance abuse.   CONSULTATIONS:  1. Pulmonology, Dr. Belia Heman. 2. ID, Dr. Leavy Cella.   PROCEDURES DONE: Bronchoscopy.   HOSPITAL COURSE: A 54 year old male who lives in assisted living, [facility]. He has history of HIV on highly active antiretroviral therapy, chronic obstructive pulmonary disease, coronary artery disease, previous cerebrovascular accident, left hemiparesis, he has recurrent falls, bipolar disorder, history of pulmonary embolism on Coumadin. He presented with shortness of breath, complaining of some cough and wheezing. He follows with Dr. Leavy Cella. He was admitted as acute hypoxic respiratory failure, pneumonia and chronic obstructive pulmonary disease exacerbation. At admission his chest x-ray reported as clear with bilateral hyperinflation consistent with chronic obstructive pulmonary disease but as per pulmonology, it looked like bilateral infiltrate. When he came in his white count was 17.8, hemoglobin 15.1, his creatinine was 0.51. LFTs were normal. He was started on IV Levaquin. He was started on IV Solu-Medrol. His ABG at admission showed pH 7.51, pCO2 30 and pO2 67 on room air. He was hypoxic. He was also started on Mepron for suspected PCP. His blood cultures remained negative. His sputum culture essentially negative, only growing few gram-positive cocci and few white blood  cells. He was given DuoNebs. He was also started on budesonide, he is on Spiriva. His hypoxia has resolved. He is saturating 94% on room air right now. Chest has some coarse breath sounds but wheezing has resolved. He feels much better. For the last two days he has been on oral steroids right now. He completed six days of Levaquin here and ID suggested to complete seven days of Levaquin so will give him one more day of Levaquin. He got a bronchoscopy done yesterday to rule out PCP. The results of that are not back yet which are going to be followed by Dr. Leavy Cella. I am going to discharge him on Mepron 750 b.i.d. for two weeks. He already completed around six days of Mepron here. He was on Coumadin. His Coumadin was stopped because of bronchoscopy, now it has been restarted yesterday. I will repeat a PT and INR before discharge. His white count on 12/30/2011 was 12.7. I think it is most likely secondary to steroids. The white count has been improving with IV antibiotics and Mepron. His urine drug screen was positive for opiates. His creatinine remained stable at 0.65. His EKG at admission shows that he had some sinus tachycardia at 108, nonspecific T wave changes. Dr. Leavy Cella actually followed him during the hospital stay as an ID consultant. His CD4 count is below 200. He is continued on Atripla and he is continued on Coumadin for his pulmonary embolus.   MEDICATIONS: I actually updated his medication list at the time of discharge. We got a medication list from his assisted living. He takes:  1. Gabapentin 600 mg p.o. t.i.d.  2. Spiriva HandiHaler 1 puff once daily.  3. Citalopram 20 mg once daily.  4. Seroquel XL 150 mg at bedtime.  5.  Divalproex ER 250 mg at bedtime.  6. Atripla 1 tablet p.o. once daily.  7. Warfarin 5 mg p.o. at bedtime.  8. Antacid 2 tablespoons every four hours as needed for indigestion.  9. Combivent metered dose inhaler 2 puffs q.4 hours while awake. 10. Milk of magnesia 30 mL  p.o. once daily as needed for constipation.  11. Acetaminophen 650 mg p.o. every 4 to 6 hours as needed for pain.  12. No medication Levaquin 750 mg p.o. once daily for one day. 13. Mepron 750 mg p.o. b.i.d. for two weeks. 14. Prednisone 50 mg p.o. once daily for 1 day then taper x 5 mg each day until completed.   CONDITION AT DISCHARGE: Comfortable. T-max 97.2, heart rate 74, blood pressure 116/64, saturating 94% on room air. Chest has some coarse breath sounds, essentially clear. Heart sounds are regular. Abdomen soft, nontender.  FOLLOW UP: Patient should follow up with Dr. Leavy CellaBlocker in one week. PT and INR on Monday, 01/02/2012 and send results to Dr. Leavy CellaBlocker. Will follow the PT-INR today before discharge.   TIME SPENT WITH DISCHARGE: Around 60 minutes.   ____________________________ Fredia SorrowAbhinav Rahil Passey, [name] [name] [name] ag:cms D: 12/31/2011 12:48:23 ET T: 12/31/2011 13:18:03 ET JOB#: [id number]  cc: Fredia SorrowAbhinav Osiah Haring, [name] [name] [name], <Dictator> [name] [name] GessMichael E. Blocker, [name] [name] [name] Fredia SorrowABHINAV Leotta Weingarten [name] [name] [name] ELECTRONICALLY SIGNED 01/13/2012 12:27

## 2015-04-19 NOTE — Consult Note (Signed)
PATIENT NAME:  [name] [name], [name] [name] MR#:  [id number] DATE OF BIRTH:  1961-03-15  DATE OF CONSULTATION:  06/26/2012  REFERRING PHYSICIAN:  Darrick MeigsSangeeta Panwar, MD   CONSULTING PHYSICIAN:  Rosalyn GessMichael E. Kaven Cumbie, MD  REASON FOR CONSULTATION: Empyema in an HIV positive patient.   HISTORY OF PRESENT ILLNESS: The patient is a 54 year old white man with a past history significant for AIDS with a CD4 count of 225 and an undetectable viral load, chronic obstructive pulmonary disease, deep vein thrombosis, pulmonary embolus, who was admitted today with worsening shortness of breath and cough for 3 to 4 weeks. The patient was seen in the office on May 30th, and at that time he was having some wheezing. He was given a course of steroids, and he states that it has not significantly improved his breathing. He was also given levofloxacin at that time. He continued to have worsening shortness of breath and eventually presented to the hospital. He denies any fevers or chills but has been having night sweats. He has had cough productive of greenish-yellow sputum. He has had no nausea or vomiting and no change in his bowels. He has baseline constipation. He was admitted to the hospital and started on Zosyn, vancomycin and azithromycin. His white count was 23.3, and a CT scan showed evidence for right-sided empyema.   ALLERGIES: Depakote, Effexor, Haldol and sulfa drugs.   PAST MEDICAL HISTORY:  1. AIDS. The patient had a CD4 count of 225 with an undetectable viral load on May 30th. He has had a persistently undetectable viral load for quite a while, although his CD4 count is not markedly improved. He continues to take PCP prophylaxis due to his fluctuating CD4 count around 200.  2. Depression.  3. Chronic obstructive pulmonary disease.  4. 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  5. Peripheral neuropathy. He is currently nonambulatory and is wheelchair-bound.  6. Deep vein thrombosis.  7. Pulmonary embolism. He has been  on chronic Coumadin due to the fact that when he has stopped in the past he has had recurrence of his pulmonary embolism.  8. Prior cocaine abuse.  9. Prior alcohol abuse.  10. Chronic urinary incontinence.  FAMILY HISTORY: Positive for throat cancer and chronic obstructive pulmonary disease.    SOCIAL HISTORY: The patient lives in a family care facility. He is a chronic smoker and continues to smoke. He is a prior alcoholic but has not been drinking recently. He has a prior cocaine history but quit in the past.    REVIEW OF SYSTEMS: GENERAL: No fevers, chills. Positive sweats. Positive malaise. Positive anorexia. HEENT: No headaches. No sinus congestion. No sore throat. NECK: No stiffness or swollen glands. RESPIRATORY: Positive cough with greenish sputum production, shortness of breath week and wheezing. CARDIAC: No chest pains or palpitations. No peripheral edema. GI: No nausea, no vomiting, no abdominal pain. Positive constipation, no diarrhea. GU: No complaints. MUSCULOSKELETAL: He has no joint or muscle complaints. SKIN: He has ulceration in his sacral area but no other rashes. NEUROLOGIC: He has chronic lower extremity weakness and urinary incontinence which has been chronic as he has been evaluated in the past by Neurology without obvious etiology. He is currently wheelchair-bound. PSYCHIATRIC: No current complaints. All other systems are negative.   PHYSICAL EXAMINATION:  VITAL SIGNS: T-max of 99.2, pulse 94, blood pressure 125/72, 94% on 3 liters.   GENERAL: A 54 year old cachectic white man in no acute distress.   HEENT: Normocephalic, atraumatic. Pupils are equal and reactive to light. Extraocular motion  intact. Sclerae, conjunctivae, and lids are without evidence for emboli or petechiae. Oropharynx shows no erythema or exudate.   NECK: Midline trachea. No lymphadenopathy. No thyromegaly.   CHEST: Clear to auscultation with the exception of some wheezes in the baseline. He had good air  movement throughout and no E to A changes or tactile fremitus appreciated.   CARDIAC: Regular rate and rhythm without murmur, rub, or gallop.   ABDOMEN: Soft, nontender, and nondistended. No hepatosplenomegaly. No hernia is noted.   EXTREMITIES: He had decreased muscle tone in both lower extremities. He had weakness of bilateral lower extremities as well. There was no evidence for tenosynovitis.   SKIN: He had multiple condylomata around the anal area. There was a fissure in the sacral area and then an area of mild redness further superiorly. He has multiple tattoos, several of which are homemade. There is no stigmata of endocarditis, specifically no Janeway lesions nor Osler nodes.   NEUROLOGICAL: The patient is awake and interactive, moving his upper extremities. He was weak in his lower extremities.   PSYCHIATRIC: Mood and affect appeared normal.   LABORATORY, DIAGNOSTIC AND RADIOLOGICAL DATA:  BUN 6, creatinine 0.63, potassium 2.8, bicarbonate 34, anion gap of 8.0. LFTs were unremarkable.  White count of 23.3, hemoglobin 11.8, platelet count of 453, INR is 3.2.  A chest x-ray showed a central mass in the right upper lobe.  A CT scan of the chest with contrast demonstrated right-sided empyema.   IMPRESSION: A 54 year old white man with a history of AIDS with a CD4 count of 225 and an undetectable viral load, chronic obstructive pulmonary disease, deep vein thrombosis, pulmonary embolus, who is admitted with empyema.   RECOMMENDATIONS:  1. His CT shows what appears to be a loculated fluid collection. His CD4 count has been around 200 for the past few years. This is unlikely to be an atypical organism, but the extent of the infection could be affected by his immunosuppression.  2. Thoracic Surgery is to see him for probable chest tube placement.  3. We will continue Zosyn and vancomycin until the pleural fluid cultures return. This is unlikely to be an atypical infection, and azithromycin  can be stopped.  4. Would continue Mepron for PCP prophylaxis.  5. We will continue HAART.  6. We will put DuoDERM on his sacral ulceration.  7. He has multiple anal condylomata. He is at risk for anal cancer. We will consider asking General Surgery to see him while in house.   This is a highly complex Infectious Disease consult.      Thank you very much for involving me in Mr. Volden care.    ____________________________ Rosalyn Gess. Makinsley Schiavi, MD meb:cbb D: 06/26/2012 16:41:51 ET T: 06/26/2012 17:30:05 ET JOB#: [id number]  cc: Rosalyn Gess. Ajmal Kathan, MD, <Dictator> Yanelle Sousa E Cheral Cappucci MD ELECTRONICALLY SIGNED 06/27/2012 8:08

## 2015-04-19 NOTE — H&P (Signed)
PATIENT NAME:  [name] [name], [name] [name] MR#:  [id number] DATE OF BIRTH:  Apr 30, 1961  DATE OF ADMISSION:  12/26/2011  REFERRING PHYSICIAN: Dr. Dolores Frame.  PRIMARY CARE PHYSICIAN:  Dr. Leavy Cella.   CHIEF COMPLAINT: Shortness of breath.   HISTORY OF PRESENT ILLNESS: The patient is a 54 year old male with past medical history listed below including HIV, chronic obstructive pulmonary disease, coronary artery disease, cerebrovascular accident, gastroesophageal reflux disease, rhabdomyolysis, recurrent falls, bipolar disorder, history of pulmonary emboli, depression, who presents to the Emergency Department from assisted living facility secondary to the above-mentioned chief complaint. The patient states that he started developing shortness of breath yesterday associated with nonproductive cough and wheezing and had difficulty sleeping through the night. Today when he awoke, he continued to experience shortness of breath and wheezing and cough and states that his lungs hurt when he breathes and also reports subjective fevers and states his temperature was taken earlier and found to be 98. He also reports diaphoresis. He denies any sick contact exposure or recent travel. He reports compliance with his medications. He is not aware of his most recent CD4 count or viral load. He was brought to the ER for further evaluation. His oxygen saturations were low on room air at 89%. Chest x-ray was obtained and revealed possibly developing patchy bilateral infiltrates. WBC count is elevated at 17.8. Thereafter, hospitalist services were contacted for further evaluation for hospital admission and otherwise, the patient is without specific complaints at this time.   PAST SURGICAL HISTORY: Removal  of anal mass.  PAST MEDICAL HISTORY:    1. Hypertension.  2. Coronary artery disease status post myocardial infarctions.  3. History of cerebrovascular accident with residual left hemiparesis. The patient uses wheelchair and walker to help with  ambulation.  4. Pulmonary emboli diagnosed 2004 for which the patient is on anticoagulation.  5. Bipolar disorder/depression.  6. History of suicidality.  7. Unspecified personality disorders. 8. History of substance abuse.  9. Chronic obstructive pulmonary disease/emphysema.  10. History of multiple falls.  11. History of rhabdomyolysis. 12. Human immunodeficiency virus with the most recent CD4 count 196 and undetectable viral load from 11/24/2011 followed by Dr. Leavy Cella.  13. History of PCP pneumonia.  14. Gastroesophageal reflux disease.  15. Tobacco abuse, ongoing.   ALLERGIES: Sulfa, Effexor, Haldol, Depakote.   MEDICATIONS: Per review of MAR from assisted living facility.  1. Spiriva handihaler one cap inhaled daily.  2. Combivent inhaler 2 puffs b.i.d.  3. Gabapentin 600 mg p.o. t.i.d.  4. Seroquel XR 200 mg p.o. at bedtime.  5. Mirtazapine 15 mg at bedtime.  6. Citalopram 20 mg daily.  7. Coumadin 8 mg p.o. daily at 5:00 p.m.  8. Carbamazepine 200 mg p.o. daily for stress disorder.  9. Atripla 600/200/300 one tab p.o. daily.  10. Milk of Magnesia 30 milliliters p.o. daily p.r.n. constipation.  11. Antacid bottle, take 2 tbsp every four hours p.r.n. for indigestion.  12. Tylenol 325 mg 2 tabs p.o. every four hours p.r.n.   FAMILY HISTORY: Negative for coronary artery disease, cerebrovascular accident, or diabetes.   SOCIAL HISTORY: Tobacco ongoing, currently smokes half pack per day, was smoking more in the past. Has been smoking since the age of 1. Alcohol, none. Illicit drugs, none currently. In the past, he was a heroin abuser and had use of IV drugs which is how he states he acquired human immunodeficiency virus.   REVIEW OF SYSTEMS: CONSTITUTIONAL: Reports fevers and diaphoresis. Denies recent changes in weight or weakness. Denies any  pain at the moment. HEAD/EYES: Denies headache or blurry vision. ENT: Denies tinnitus, earache, nasal discharge, or sore throat.  RESPIRATORY: Has shortness of breath, cough, and wheezing. CARDIOVASCULAR: Denies chest pain, heart palpitations, lower extremity edema. GASTROINTESTINAL: Denies nausea, vomiting, diarrhea, constipation, melena, hematochezia, or abdominal pains. GENITOURINARY: Denies dysuria or hematuria. ENDOCRINE: Denies heat or cold intolerance. HEME/LYMPH: Denies easy bruising or bleeding. INTEGUMENTARY:  Denies rash. MUSCULOSKELETAL: Denies joint pain or muscle weakness. NEUROLOGIC: Denies headache, numbness, weakness, tingling, or dysarthria. PSYCHIATRIC: Has underlying bipolar disorder.   PHYSICAL EXAMINATION:  VITAL SIGNS: Temperature 98, pulse 103, blood pressure 149/74, respirations 23, oxygen saturation 89% on room air.   GENERAL: The patient is alert and oriented, in mild respiratory distress.   HEENT: Normocephalic. He has right periorbital bruising and small dry laceration on the left side of his forehead and states that this is from when he recently fell and landed on his face. Pupils are equal, round and reactive to light and accommodation. Extraocular muscles are intact. Anicteric sclerae. Conjunctivae pink. Hearing intact to voice. Nares without drainage. Oral mucosa is moist without lesions. No oropharyngeal thrush noted.   NECK: Supple with full range of motion. He has shotty cervical lymphadenopathy. No jugular venous distention or carotid bruits. No thyromegaly or tenderness to palpation over thyroid gland.   CHEST: Increased respiratory effort. The patient is tachypneic but not using his accessory respiratory muscles to breathe. He has decreased breath sounds bilaterally with hyperresonance to percussion and moderate wheezing in all lung fields. No crackles or rales.   CARDIOVASCULAR: S1, S2 positive, but distant. Slightly tachycardic. No murmurs, rubs, or gallops. PMI is non-lateralized.   ABDOMEN: Soft, nontender, nondistended. Normoactive bowel sounds. No hepatosplenomegaly or palpable  masses. No hernias.   EXTREMITIES: No clubbing, cyanosis, or edema. Pedal pulses are palpable bilaterally.   SKIN: No suspicious rashes or ulcer. Skin turgor is good.   LYMPH: Shotty cervical lymphadenopathy.   NEUROLOGIC: Alert and oriented x3. Cranial nerves 2-12 grossly intact. He has residual left hemiparesis after a stroke which has not recently changed. Otherwise, there are no focal deficits.   PSYCH: Appropriate affect.   LABORATORY, DIAGNOSTIC, AND RADIOLOGICAL DATA: Per Dr. Sharrell KuBlocker's office, most recent CD4 count was 196 from 11/24/2011 and he had undetectable viral load. Labs from today: CBC within normal limits except for WBC elevated at 17.8. Portable chest x-ray: Possible developing patchy bilateral infiltrates and hyperinflated lungs consistent with chronic obstructive pulmonary disease. Complete metabolic panel: Sodium 142, potassium 4.8, chloride 112, bicarbonate 22, BUN 10, creatinine 0.51, glucose 114, calcium 8.7, anion gap 8. Total protein, albumin, total bilirubin, alkaline phosphatase, AST and ALT levels are within normal limits. Troponin 0.04. INR 1.3. EKG with sinus tachycardia at 106 beats per minute with normal axis, normal intervals, some Q waves inferiorly which are also noted on EKG 05/26/2011. Otherwise, he has some nonspecific ST and T wave change. No acute ischemic change noted.   ASSESSMENT AND PLAN: 54 year old male with past medical history of hypertension, coronary artery disease, cerebrovascular accident, chronic obstructive pulmonary disease with ongoing tobacco abuse, recurrent falls, pulmonary emboli, bipolar disorder, rhabdomyolysis, history of suicidality, history of human immunodeficiency virus, here with one-day history of shortness of breath, cough, wheezing, subjective fevers and diaphoresis who was found to be hypoxic on room air, here with:  1. Acute hypoxic respiratory failure, suspect due to pneumonia causing acute chronic obstructive pulmonary disease  exacerbation for which hospital admission is recommended for further evaluation and management. We will admit  the patient to Med/Surg unit with off0unit tele. For now, will keep the patient on supplemental oxygen via nasal cannula and titrate to keep his sats greater than 90%. Obtain blood cultures and sputum culture if able and start antibiotics for pneumonia and also start IV steroids as well as bronchodilator support for his acute chronic obstructive pulmonary disease exacerbation. Please see below for further details.  2. Possible bilateral pneumonia. The case has been discussed with Dr. Leavy Cella. The patient's last known CD4 count was 196 from 11/24/2011 with undetectable viral load. PJP pneumonia would be on the list of differential diagnoses. The patient is sulfa allergic. Therefore, recommendation has been made to start Mepron. Also, in the event of community-acquired pneumonia, the patient will be started on IV Levaquin. Will obtain formal infectious disease and pulmonology consultation. The patient may require bronchoscopy with BAL for definitive diagnosis of PJP. In the meanwhile, will also check an ABG and sputum culture if able and blood cultures have already been drawn in the ER and will be followed. Further work-up and management to follow depending on the patient's clinical course. 3. Acute chronic obstructive pulmonary disease exacerbation, suspected due to pneumonia. We will keep the patient on oxygen, IV steroids, antibiotics listed above and also bronchodilator support with scheduled DuoNebs in addition to albuterol metered dose inhaler p.r.n. Also start Pulmicort nebs. We will also continue Spiriva. The patient states he did not respond well to Advair in the past. Will monitor clinical response and also obtain pulmonology consultation for further recommendations.  4. History of human immunodeficiency virus. The patient is currently at risk for opportunistic infections given his CD4 count less  than 200. He is at high risk for PCP. For now, he will be treated with Mepron given suspected pneumonia. He will be seen in consultation by Dr. Leavy Cella of ID. In the meanwhile, we will continue antiretroviral therapy with Atripla.  5. Leukocytosis, felt to be due to pneumonia. Blood cultures have been obtained. The patient will be maintained on antibiotics and we will follow WBC count closely. However, need to interpret with caution, as he will also be on IV steroids.  6. History of pulmonary emboli with subtherapeutic INR. Will increase Coumadin dose and follow PT/INR. The patient may not be the best long-term anticoagulation candidate given history of recurrent falls and he also fell again recently, but will defer this decision to the patient's primary MD.  7. History of coronary artery disease status post myocardial infarction. Continue Coumadin and will cycle the patient's cardiac enzymes, although he denies chest pain at this time.  8. History of cerebrovascular accident. Continue Coumadin. Check lipid.  9. Hypertension. The patient is not on any blood pressure medications at baseline. Will not start any acutely and will monitor blood pressures closely while hospitalized.  10. Bipolar disorder. Continue Celexa, Seroquel, carbamazepine and mirtazapine. 11. Gastroesophageal reflux disease. Keep the patient on PPI for now.  12. Deep venous thrombosis prophylaxis Coumadin.  Plan of care was discussed with the patient who is currently in agreement with the above.  TIME SPENT ON THIS ADMISSION: Approximately 60 minutes.  ____________________________ Elon Alas, MD knl:ap D: 12/26/2011 09:06:41 ET T: 12/26/2011 10:04:56 ET JOB#: [id number] cc: Elon Alas, MD, <Dictator> Rosalyn Gess. Blocker, MD Elon Alas MD ELECTRONICALLY SIGNED 01/12/2012 18:55

## 2015-04-19 NOTE — Op Note (Signed)
PATIENT NAME:  [name] [name], [name] [name] MR#:  [id number] DATE OF BIRTH:  November 19, 1961  DATE OF PROCEDURE:  06/27/2012  PREOPERATIVE DIAGNOSIS: Empyema, right chest.   POSTOPERATIVE DIAGNOSIS: Empyema, right chest.  OPERATION PERFORMED:  1. Preoperative bronchoscopy to assess endobronchial anatomy.  2. Right thoracoscopy.  3. Conversion to a right thoracotomy.  4. Decortication of visceral and parietal pleura.   SURGEON: Jasmine Decemberimothy E Braylea Brancato, MD   ASSISTANT: Natale LayMark Bird, MD    INDICATIONS FOR PROCEDURE: [name] [name] is a 54 year old gentleman with a three-month history of shortness of breath and right-sided chest pain with fever. He presented to the Emergency Department where a CT scan confirmed the presence of a large abscess in the pleural space. He was apprised of the indications and risks of the procedure. He gave his informed consent.   DESCRIPTION OF PROCEDURE: The patient was brought to the operating suite and placed in the supine position. General endotracheal anesthesia was given. The patient was first intubated with a 7-0 tube and bronchoscopy was carried out. There was no evidence of endobronchial tumor, although there was extensive purulent secretions present in the right lower lobe. The right lower lobe was extrinsically compressed, and I did not see any obvious tumor. There were extensive secretions throughout all lobes of the lung but particularly in the right lower lobe.  At this point, we attempted to intubate the patient with a double-lumen tube but were unsuccessful, and therefore we placed a single-lumen tube and positioned a bronchial blocker in the right mainstem bronchus. The patient was then turned for a right thoracoscopy. All pressure points were carefully padded. The patient was prepped and draped in the usual sterile fashion.   We began by making an incision anteriorly to accommodate a 15 mm port. When the pleural space was entered, we found that there were extensive adhesions anteriorly, and it  was not possible to negotiate the pleural space. We, therefore, had to open the chest. An incision was extended posteriorly and converted into a posterolateral fifth interspace thoracotomy. Once the chest was opened, we began by mobilizing the lung. We encountered a very large abscess within the pleural space which encompassed the bottom portion of the chest. There were extensive adhesions present. We freed up the lower lobe off the diaphragm and freed it up anteriorly. The phrenic nerve was identified and protected. We decorticated the entire visceral pleura. The most difficult portion was at the most inferior aspect of the right lower lobe. In this area, the visceral pleura was densely adherent, and even going in a subpleural plane made it very difficult to remove. In any event, we decorticated the lung and opened all the fissures. There was no untoward complications during this procedure. Extensive irrigation of the chest was then performed, and the chest was drained with three large 32-French Blake drains. They were secured to the chest wall with interrupted 3-0 chromic. The chest was then closed with #2 Vicryl paracostal sutures; #2 Vicryl was used to reapproximate the muscles of the chest wall, and 2-0 Vicryl and skin clips completed the procedure. The chest tubes were secured with Prolene. The patient was then rolled in the supine position where he was taken to the Intensive Care Unit in critical but stable condition.    ____________________________ Sheppard Plumberimothy E. Thelma Bargeaks, MD teo:cbb D: 06/27/2012 18:56:27 ET T: 06/29/2012 10:18:08 ET JOB#: [id number]  cc: Marcial Pacasimothy E. Thelma Bargeaks, MD, <Dictator> Mark A. Egbert GaribaldiBird, MD Jasmine DecemberIMOTHY E Maxamillian Tienda MD ELECTRONICALLY SIGNED 07/04/2012 9:42

## 2015-04-19 NOTE — H&P (Signed)
PATIENT NAME:  [name] [name], [name] [name] MR#:  [id number] DATE OF BIRTH:  09/23/1961  DATE OF ADMISSION:  06/26/2012  PRIMARY CARE PHYSICIAN: Dr. Leavy Cella    CHIEF COMPLAINT: Increased shortness of breath x3 weeks.   HISTORY OF PRESENT ILLNESS: [name] is a 54 year old Caucasian male with history of HIV positive, history of chronic obstructive pulmonary disease and ongoing tobacco abuse, history of pulmonary embolism, and history of cerebrovascular accident with left hemiparesis. The patient reported that over the last three weeks he noticed having shortness of breath which is getting worse and lately over the last one week he has cough with greenish sputum production. He reports some fever and chills as well. The patient reports that he had failed outpatient treatment with prednisone. Evaluation here in the Emergency Department reveals significant right left-sided consolidation of his right lung. The patient was admitted for further evaluation and management.   REVIEW OF SYSTEMS: CONSTITUTIONAL: Reports some fever and chills and mild fatigue. EYES: No blurring of vision. No double vision. ENT: No hearing impairment. No sore throat. No dysphagia. CARDIOVASCULAR: No chest pain but he states after coughing a lot he feels he is sore in his chest and on the sides of his chest. He reports shortness of breath for the last three weeks. No edema. No syncope. RESPIRATORY: Admits to shortness of breath and sputum production. No hemoptysis. GASTROINTESTINAL: No abdominal pain. No vomiting. No diarrhea. GENITOURINARY: No dysuria. No frequency of urination. MUSCULOSKELETAL: No joint pain or swelling. No muscular pain or swelling. INTEGUMENTARY: No skin rash. No ulcers. NEUROLOGY: No focal weakness. No seizure activity. No headache. PSYCHIATRY: No anxiety. No depression. Only by history. ENDOCRINE: No polyuria or polydipsia. No heat or cold intolerance.   PAST MEDICAL HISTORY:  1. HIV positive. 2. Chronic obstructive pulmonary  disease. 3. Coronary artery disease. 4. History of cerebrovascular accident with resultant left hemiparesis.  5. Pulmonary embolism diagnosed in 2004.  6. Chronic anticoagulation with Coumadin. 7. History of bipolar manic depressive illness.  8. Past history of suicidality.  9. History of substance abuse including heroin, however, the patient denies that. He said that he never abused heroin or IV drugs. He states that he encountered HIV through sexual encounter.  10. Ongoing tobacco abuse.   FAMILY HISTORY: His father suffered from COPD. Negative family history for premature coronary artery disease or stroke.   SOCIAL HABITS: Chronic smoker. He continues to smoke about four cigars a day. He used to smoke cigarettes in the past. He started smoking at age of 89. 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 The patient denies.   SOCIAL HISTORY: The patient lives in a nursing home facility.   ADMISSION MEDICATIONS:  1. Spiriva 1 inhalation once a day. 2. Symbicort 160/4.5 2 puffs twice a day. 3. Gabapentin 600 mg 3 times a day. 4. Citalopram 20 mg a day. 5. Seroquel XL 150 mg once a day.  6. Divalproex ER 250 mg at bedtime. 7. Atripla 600/200/300 once a day. 8. Mepron 750 mg once a day.  9. Coumadin. He has two doses Coumadin 5 mg and 6 mg. 10. Colace 100 mg twice a day. 11. Benadryl 25 mg at bedtime p.r.n.   ALLERGIES: Reported to sulfa. The patient has no idea if he is allergic to sulfa and he states he does not know. Also reported Depakote,  Haldol, and Effexor.   PHYSICAL EXAMINATION:   VITAL SIGNS: Blood pressure 127/77, respiratory rate 22, pulse 84, temperature 99.4, oxygen saturation 94% and this is on oxygen.   GENERAL APPEARANCE: Middle-aged male lying down on stretcher in no acute distress other than he looks in mild respiratory  difficulty.   HEAD: No pallor. No icterus. No cyanosis.   EARS, NOSE, AND THROAT: Hearing was normal. Nasal mucosa, lips, and tongue were normal except for evidence of oral thrush.   EYES: Normal eyelids and conjunctivae. Pupils about 4 to 5 mm, equal and reactive to light.   NECK: Supple. Trachea at midline. No thyromegaly. No cervical masses.   HEART: Distant heart sounds. I could not hear S1 or S2. I did not hear any murmur. No carotid bruits.   RESPIRATORY: Slight tachypnea, however, the patient is not using accessory muscles. There are decreased breathing sounds bilaterally, diffuse rhonchi, few wheezing.   ABDOMEN: Soft without tenderness. No hepatosplenomegaly. No masses. No hernias.   SKIN: No ulcers. No subcutaneous nodules. He has extensive onychomycosis of toenails.   MUSCULOSKELETAL: No joint swelling. No clubbing.   NEUROLOGIC: Cranial nerves II through XII are intact. No focal motor deficit other than residual mild left-sided weakness from old stroke.   PSYCHIATRY: The patient is alert and oriented x3. Mood and affect were flat.   LABORATORY, DIAGNOSTIC, AND RADIOLOGICAL DATA: Chest x-ray showed significant consolidation on the right side of the lung almost half of the lower half of the lung is wiped out. I cannot rule out underlying pleural effusion.   EKG showed normal sinus rhythm at rate of 94 per minute. Q waves in inferior leads. T wave inversion in the anterolateral leads.   B-type Natriuretic Peptide (BNP) was elevated at 1473. Blood sugar 101, BUN 6, creatinine 0.63, sodium 132, potassium 2.8. The liver function tests were normal except for low albumin at 2. CPK 99. Troponin 0.05. Valproic acid level was 11. CBC showed white count of 23,000, hemoglobin 11.8, hematocrit 37, platelet count 453. Prothrombin time 32.9 with INR of 3.2.   ASSESSMENT:  1. Significant right-sided pneumonia, cannot rule out if there is effusion behind it as well.  2. HIV  positive. 3. Acute exacerbation of chronic obstructive pulmonary disease.  4. Oral candidiasis.  5. Elevated B-type Natriuretic Peptide raising the question if there is volume overload or not.  6. Mild hyponatremia.  7. Significant hypokalemia with potassium of 2.8.  8. Significant leukocytosis at 23,000.  9. Coagulopathy secondary to mild Coumadin toxicity with INR of 3.2.  10. History of pulmonary embolism, on chronic anticoagulation. 11. History of stroke with resultant left hemiparesis.  12. History of coronary artery disease.  13. History of bipolar manic depressive illness.   PLAN:  1. Will admit the patient to the telemetry unit. 2. Sputum for culture and sensitivity.  3. Infectious Disease consultation with Dr. Leavy Cella.  4. Empiric IV antibiotic using combination of Zosyn, vancomycin, and Zithromax.  5. The patient is allergic to sulfa or he is unsure. This will make me hesitant to use Bactrim.  6. Intensify treatment of his COPD exacerbation with DuoNebs.  7. I will avoid IV steroids at this time.  8. For his oral candidiasis, I will start Diflucan.  9. Given the picture of the chest x-ray and elevated BNP and mild hyponatremia, I would like to place rule out underlying volume overload or CHF. Therefore, I ordered echocardiogram. This will be also helpful to see if there is pleural  effusion.  10. Regarding the elevated prothrombin time and INR, I will keep Coumadin on hold.  11. Continue the rest of home medications.   TIME SPENT EVALUATING THIS PATIENT: More than one hour.  ____________________________ Carney CornersAmir M. Rudene Rearwish, MD amd:drc D: 06/26/2012 03:36:33 ET T: 06/26/2012 08:52:58 ET JOB#: [id number] cc: Carney CornersAmir M. Rudene Rearwish, MD, <Dictator>, Rosalyn GessMichael E. Blocker, MD Zollie ScaleAMIR M Gean Larose MD ELECTRONICALLY SIGNED 06/27/2012 6:53
